# Patient Record
Sex: Female | Born: 1960 | Race: White | Hispanic: No | Smoking: Current every day smoker
Health system: Southern US, Community
[De-identification: ages and names within clinical notes are randomized; demographics above are authoritative.]

## PROBLEM LIST (undated history)

## (undated) DIAGNOSIS — I7300 Raynaud's syndrome without gangrene: Secondary | ICD-10-CM

## (undated) DIAGNOSIS — D751 Secondary polycythemia: Secondary | ICD-10-CM

## (undated) DIAGNOSIS — M329 Systemic lupus erythematosus, unspecified: Secondary | ICD-10-CM

## (undated) DIAGNOSIS — C801 Malignant (primary) neoplasm, unspecified: Secondary | ICD-10-CM

## (undated) DIAGNOSIS — IMO0002 Reserved for concepts with insufficient information to code with codable children: Secondary | ICD-10-CM

## (undated) HISTORY — DX: Secondary polycythemia: D75.1

## (undated) HISTORY — PX: HIP SURGERY: SHX245

## (undated) HISTORY — DX: Malignant (primary) neoplasm, unspecified: C80.1

## (undated) HISTORY — DX: Raynaud's syndrome without gangrene: I73.00

## (undated) HISTORY — DX: Reserved for concepts with insufficient information to code with codable children: IMO0002

## (undated) HISTORY — PX: BACK SURGERY: SHX140

## (undated) HISTORY — PX: ABDOMINAL HYSTERECTOMY: SHX81

## (undated) HISTORY — DX: Systemic lupus erythematosus, unspecified: M32.9

---

## 2009-11-26 DIAGNOSIS — M329 Systemic lupus erythematosus, unspecified: Secondary | ICD-10-CM | POA: Insufficient documentation

## 2011-09-20 DIAGNOSIS — I7300 Raynaud's syndrome without gangrene: Secondary | ICD-10-CM | POA: Insufficient documentation

## 2012-06-09 DIAGNOSIS — L4050 Arthropathic psoriasis, unspecified: Secondary | ICD-10-CM | POA: Insufficient documentation

## 2012-10-31 DIAGNOSIS — G894 Chronic pain syndrome: Secondary | ICD-10-CM | POA: Insufficient documentation

## 2013-11-12 DIAGNOSIS — M25559 Pain in unspecified hip: Secondary | ICD-10-CM | POA: Diagnosis not present

## 2013-11-12 DIAGNOSIS — M25519 Pain in unspecified shoulder: Secondary | ICD-10-CM | POA: Diagnosis not present

## 2014-01-15 DIAGNOSIS — N63 Unspecified lump in unspecified breast: Secondary | ICD-10-CM | POA: Diagnosis not present

## 2014-02-19 DIAGNOSIS — G894 Chronic pain syndrome: Secondary | ICD-10-CM | POA: Diagnosis not present

## 2014-02-19 DIAGNOSIS — M25512 Pain in left shoulder: Secondary | ICD-10-CM | POA: Diagnosis not present

## 2014-02-19 DIAGNOSIS — M25511 Pain in right shoulder: Secondary | ICD-10-CM | POA: Diagnosis not present

## 2014-02-19 DIAGNOSIS — M25551 Pain in right hip: Secondary | ICD-10-CM | POA: Diagnosis not present

## 2014-02-19 DIAGNOSIS — M25552 Pain in left hip: Secondary | ICD-10-CM | POA: Diagnosis not present

## 2014-04-08 DIAGNOSIS — K088 Other specified disorders of teeth and supporting structures: Secondary | ICD-10-CM | POA: Diagnosis not present

## 2014-04-08 DIAGNOSIS — F17200 Nicotine dependence, unspecified, uncomplicated: Secondary | ICD-10-CM | POA: Diagnosis not present

## 2014-05-14 DIAGNOSIS — Z79899 Other long term (current) drug therapy: Secondary | ICD-10-CM | POA: Diagnosis not present

## 2014-05-14 DIAGNOSIS — M1990 Unspecified osteoarthritis, unspecified site: Secondary | ICD-10-CM | POA: Diagnosis not present

## 2014-05-14 DIAGNOSIS — I7300 Raynaud's syndrome without gangrene: Secondary | ICD-10-CM | POA: Diagnosis not present

## 2014-05-14 DIAGNOSIS — Z5181 Encounter for therapeutic drug level monitoring: Secondary | ICD-10-CM | POA: Diagnosis not present

## 2014-05-14 DIAGNOSIS — M25552 Pain in left hip: Secondary | ICD-10-CM | POA: Diagnosis not present

## 2014-05-14 DIAGNOSIS — M25551 Pain in right hip: Secondary | ICD-10-CM | POA: Diagnosis not present

## 2014-06-13 DIAGNOSIS — K047 Periapical abscess without sinus: Secondary | ICD-10-CM | POA: Diagnosis not present

## 2014-07-30 DIAGNOSIS — I7300 Raynaud's syndrome without gangrene: Secondary | ICD-10-CM | POA: Diagnosis not present

## 2014-07-30 DIAGNOSIS — M25512 Pain in left shoulder: Secondary | ICD-10-CM | POA: Diagnosis not present

## 2014-07-30 DIAGNOSIS — M25552 Pain in left hip: Secondary | ICD-10-CM | POA: Diagnosis not present

## 2014-07-30 DIAGNOSIS — M25511 Pain in right shoulder: Secondary | ICD-10-CM | POA: Diagnosis not present

## 2014-07-30 DIAGNOSIS — M1990 Unspecified osteoarthritis, unspecified site: Secondary | ICD-10-CM | POA: Diagnosis not present

## 2014-07-30 DIAGNOSIS — G894 Chronic pain syndrome: Secondary | ICD-10-CM | POA: Diagnosis not present

## 2014-07-30 DIAGNOSIS — M329 Systemic lupus erythematosus, unspecified: Secondary | ICD-10-CM | POA: Diagnosis not present

## 2014-07-30 DIAGNOSIS — M25551 Pain in right hip: Secondary | ICD-10-CM | POA: Diagnosis not present

## 2014-09-04 DIAGNOSIS — M25511 Pain in right shoulder: Secondary | ICD-10-CM | POA: Diagnosis not present

## 2014-09-04 DIAGNOSIS — M25512 Pain in left shoulder: Secondary | ICD-10-CM | POA: Diagnosis not present

## 2014-09-04 DIAGNOSIS — G894 Chronic pain syndrome: Secondary | ICD-10-CM | POA: Diagnosis not present

## 2014-09-04 DIAGNOSIS — I7300 Raynaud's syndrome without gangrene: Secondary | ICD-10-CM | POA: Diagnosis not present

## 2014-09-04 DIAGNOSIS — M25552 Pain in left hip: Secondary | ICD-10-CM | POA: Diagnosis not present

## 2014-09-04 DIAGNOSIS — L4050 Arthropathic psoriasis, unspecified: Secondary | ICD-10-CM | POA: Diagnosis not present

## 2014-09-04 DIAGNOSIS — G8929 Other chronic pain: Secondary | ICD-10-CM | POA: Diagnosis not present

## 2014-09-04 DIAGNOSIS — M25551 Pain in right hip: Secondary | ICD-10-CM | POA: Diagnosis not present

## 2014-09-24 DIAGNOSIS — M25551 Pain in right hip: Secondary | ICD-10-CM | POA: Diagnosis not present

## 2014-09-24 DIAGNOSIS — Z79899 Other long term (current) drug therapy: Secondary | ICD-10-CM | POA: Diagnosis not present

## 2014-09-24 DIAGNOSIS — G894 Chronic pain syndrome: Secondary | ICD-10-CM | POA: Diagnosis not present

## 2014-09-24 DIAGNOSIS — M25552 Pain in left hip: Secondary | ICD-10-CM | POA: Diagnosis not present

## 2014-09-24 DIAGNOSIS — L4050 Arthropathic psoriasis, unspecified: Secondary | ICD-10-CM | POA: Diagnosis not present

## 2014-09-24 DIAGNOSIS — Z5181 Encounter for therapeutic drug level monitoring: Secondary | ICD-10-CM | POA: Diagnosis not present

## 2014-09-24 DIAGNOSIS — I7300 Raynaud's syndrome without gangrene: Secondary | ICD-10-CM | POA: Diagnosis not present

## 2014-11-06 DIAGNOSIS — G8929 Other chronic pain: Secondary | ICD-10-CM | POA: Diagnosis not present

## 2014-11-06 DIAGNOSIS — M792 Neuralgia and neuritis, unspecified: Secondary | ICD-10-CM | POA: Diagnosis not present

## 2014-11-06 DIAGNOSIS — M25552 Pain in left hip: Secondary | ICD-10-CM | POA: Diagnosis not present

## 2014-12-17 DIAGNOSIS — G894 Chronic pain syndrome: Secondary | ICD-10-CM | POA: Diagnosis not present

## 2014-12-17 DIAGNOSIS — M25552 Pain in left hip: Secondary | ICD-10-CM | POA: Diagnosis not present

## 2014-12-17 DIAGNOSIS — G8929 Other chronic pain: Secondary | ICD-10-CM | POA: Diagnosis not present

## 2014-12-30 DIAGNOSIS — M1612 Unilateral primary osteoarthritis, left hip: Secondary | ICD-10-CM | POA: Diagnosis not present

## 2014-12-30 DIAGNOSIS — M25552 Pain in left hip: Secondary | ICD-10-CM | POA: Diagnosis not present

## 2015-02-11 DIAGNOSIS — M25551 Pain in right hip: Secondary | ICD-10-CM | POA: Diagnosis not present

## 2015-02-11 DIAGNOSIS — M25552 Pain in left hip: Secondary | ICD-10-CM | POA: Diagnosis not present

## 2015-02-11 DIAGNOSIS — G894 Chronic pain syndrome: Secondary | ICD-10-CM | POA: Diagnosis not present

## 2015-02-11 DIAGNOSIS — M25512 Pain in left shoulder: Secondary | ICD-10-CM | POA: Diagnosis not present

## 2015-03-09 DIAGNOSIS — M25552 Pain in left hip: Secondary | ICD-10-CM | POA: Diagnosis not present

## 2015-03-09 DIAGNOSIS — M329 Systemic lupus erythematosus, unspecified: Secondary | ICD-10-CM | POA: Diagnosis not present

## 2015-03-09 DIAGNOSIS — F4310 Post-traumatic stress disorder, unspecified: Secondary | ICD-10-CM | POA: Diagnosis not present

## 2015-03-09 DIAGNOSIS — M1612 Unilateral primary osteoarthritis, left hip: Secondary | ICD-10-CM | POA: Diagnosis not present

## 2015-03-09 DIAGNOSIS — M169 Osteoarthritis of hip, unspecified: Secondary | ICD-10-CM | POA: Diagnosis not present

## 2015-03-09 DIAGNOSIS — Z87891 Personal history of nicotine dependence: Secondary | ICD-10-CM | POA: Diagnosis not present

## 2015-03-12 DIAGNOSIS — Z5181 Encounter for therapeutic drug level monitoring: Secondary | ICD-10-CM | POA: Diagnosis not present

## 2015-03-12 DIAGNOSIS — Z79899 Other long term (current) drug therapy: Secondary | ICD-10-CM | POA: Diagnosis not present

## 2015-04-23 DIAGNOSIS — G894 Chronic pain syndrome: Secondary | ICD-10-CM | POA: Diagnosis not present

## 2015-04-23 DIAGNOSIS — M25552 Pain in left hip: Secondary | ICD-10-CM | POA: Diagnosis not present

## 2015-06-18 DIAGNOSIS — M25552 Pain in left hip: Secondary | ICD-10-CM | POA: Diagnosis not present

## 2015-06-18 DIAGNOSIS — M25551 Pain in right hip: Secondary | ICD-10-CM | POA: Diagnosis not present

## 2015-06-18 DIAGNOSIS — G894 Chronic pain syndrome: Secondary | ICD-10-CM | POA: Diagnosis not present

## 2015-07-14 DIAGNOSIS — M25551 Pain in right hip: Secondary | ICD-10-CM | POA: Diagnosis not present

## 2015-07-14 DIAGNOSIS — G8929 Other chronic pain: Secondary | ICD-10-CM | POA: Diagnosis not present

## 2015-07-14 DIAGNOSIS — Z79899 Other long term (current) drug therapy: Secondary | ICD-10-CM | POA: Diagnosis not present

## 2015-07-14 DIAGNOSIS — Z5181 Encounter for therapeutic drug level monitoring: Secondary | ICD-10-CM | POA: Diagnosis not present

## 2015-07-14 DIAGNOSIS — M546 Pain in thoracic spine: Secondary | ICD-10-CM | POA: Diagnosis not present

## 2015-07-14 DIAGNOSIS — G894 Chronic pain syndrome: Secondary | ICD-10-CM | POA: Diagnosis not present

## 2015-07-31 DIAGNOSIS — R079 Chest pain, unspecified: Secondary | ICD-10-CM | POA: Diagnosis not present

## 2015-07-31 DIAGNOSIS — F17210 Nicotine dependence, cigarettes, uncomplicated: Secondary | ICD-10-CM | POA: Diagnosis not present

## 2015-07-31 DIAGNOSIS — F17200 Nicotine dependence, unspecified, uncomplicated: Secondary | ICD-10-CM | POA: Diagnosis not present

## 2015-07-31 DIAGNOSIS — R0789 Other chest pain: Secondary | ICD-10-CM | POA: Diagnosis not present

## 2015-07-31 DIAGNOSIS — Z8249 Family history of ischemic heart disease and other diseases of the circulatory system: Secondary | ICD-10-CM | POA: Diagnosis not present

## 2015-08-30 DIAGNOSIS — M329 Systemic lupus erythematosus, unspecified: Secondary | ICD-10-CM | POA: Diagnosis not present

## 2015-08-30 DIAGNOSIS — G8929 Other chronic pain: Secondary | ICD-10-CM | POA: Diagnosis not present

## 2015-09-07 DIAGNOSIS — G894 Chronic pain syndrome: Secondary | ICD-10-CM | POA: Diagnosis not present

## 2015-09-07 DIAGNOSIS — M25551 Pain in right hip: Secondary | ICD-10-CM | POA: Diagnosis not present

## 2015-09-07 DIAGNOSIS — M25552 Pain in left hip: Secondary | ICD-10-CM | POA: Diagnosis not present

## 2015-09-17 DIAGNOSIS — M25552 Pain in left hip: Secondary | ICD-10-CM | POA: Diagnosis not present

## 2015-09-17 DIAGNOSIS — M25551 Pain in right hip: Secondary | ICD-10-CM | POA: Diagnosis not present

## 2015-09-28 DIAGNOSIS — Z5181 Encounter for therapeutic drug level monitoring: Secondary | ICD-10-CM | POA: Diagnosis not present

## 2015-09-28 DIAGNOSIS — M25552 Pain in left hip: Secondary | ICD-10-CM | POA: Diagnosis not present

## 2015-09-28 DIAGNOSIS — G894 Chronic pain syndrome: Secondary | ICD-10-CM | POA: Diagnosis not present

## 2015-09-28 DIAGNOSIS — M25551 Pain in right hip: Secondary | ICD-10-CM | POA: Diagnosis not present

## 2015-09-28 DIAGNOSIS — Z79899 Other long term (current) drug therapy: Secondary | ICD-10-CM | POA: Diagnosis not present

## 2015-09-28 DIAGNOSIS — M25512 Pain in left shoulder: Secondary | ICD-10-CM | POA: Diagnosis not present

## 2015-10-16 ENCOUNTER — Encounter: Payer: Self-pay | Admitting: Family Medicine

## 2015-10-16 ENCOUNTER — Ambulatory Visit (INDEPENDENT_AMBULATORY_CARE_PROVIDER_SITE_OTHER): Payer: Medicare Other | Admitting: Family Medicine

## 2015-10-16 VITALS — BP 113/59 | HR 59 | Temp 98.0°F | Ht 69.0 in | Wt 135.0 lb

## 2015-10-16 DIAGNOSIS — Z7189 Other specified counseling: Secondary | ICD-10-CM

## 2015-10-16 DIAGNOSIS — Z7689 Persons encountering health services in other specified circumstances: Secondary | ICD-10-CM

## 2015-10-16 DIAGNOSIS — M329 Systemic lupus erythematosus, unspecified: Secondary | ICD-10-CM | POA: Insufficient documentation

## 2015-10-16 NOTE — Progress Notes (Signed)
   Subjective:    Patient ID: Bridget Young, female    DOB: 05/25/1960, 55 y.o.   MRN: [id number]  HPI Patient here today to establish care. She moved here from [facility] and her last PCP was in [place]. She is also seeing a pain Clinic for her Lupus.  She has had lupus for some years now and is been on antimalarials but none for a long time. She does not tolerate prednisone. When she has flares from time to time she mostly just stays and rests on her sofa and weights for the flare to improve. She has no end organ damage that she is aware of from the lupus.  More recently she has had pain in her hips such that she requires morphine. This is being managed by pain clinic in [place].     There are no active problems to display for this patient.  Outpatient Encounter Prescriptions as of 10/16/2015  Medication Sig  . diphenhydrAMINE (BENADRYL) 25 MG tablet Take 25 mg by mouth every 6 (six) hours as needed.  Marland Kitchen morphine (MSIR) 30 MG tablet Take 1 tablet by mouth 2 (two) times daily.  Marland Kitchen topiramate (TOPAMAX) 50 MG tablet Take 1 tablet by mouth 2 (two) times daily.   No facility-administered encounter medications on file as of 10/16/2015.      Review of Systems  Constitutional: Negative.   HENT: Negative.   Eyes: Negative.   Respiratory: Negative.   Cardiovascular: Negative.   Gastrointestinal: Negative.   Endocrine: Negative.   Genitourinary: Negative.   Musculoskeletal: Positive for arthralgias (shoulder and hip pain).  Skin: Negative.   Allergic/Immunologic: Negative.   Neurological: Negative.   Hematological: Negative.   Psychiatric/Behavioral: Negative.        Objective:   Physical Exam  Constitutional: She appears well-developed and well-nourished.  Cardiovascular: Normal rate, regular rhythm and normal heart sounds.   Pulmonary/Chest: Effort normal and breath sounds normal.  Musculoskeletal:  Hips: Increased pain with internal/external rotation and patient has  limitation in flexion. Is able to flex to about 90.   BP 113/59 mmHg  Pulse 59  Temp(Src) 98 F (36.7 C) (Oral)  Ht '5\' 9"'$  (1.753 m)  Wt 135 lb (61.236 kg)  BMI 19.93 kg/m2        Assessment & Plan:  1. Establishing care with new doctor, encounter for   2. Lupus ([place]) We'll assess renal function liver and blood counts. May need referral to rheumatologist in the future but patient is reluctant to do so. May also need hip replacements based on her history but she also talked about mobility device  Wardell Honour MD - CBC with Differential/Platelet - CMP14+EGFR

## 2015-10-16 NOTE — Patient Instructions (Signed)
Continue current medications. Continue good therapeutic lifestyle changes which include good diet and exercise. Fall precautions discussed with patient. If an FOBT was given today- please return it to our front desk. If you are over 53 years old - you may need Prevnar 13 or the adult Pneumonia vaccine.   After your visit with us today you will receive a survey in the mail or online from [employer] regarding your care with us. Please take a moment to fill this out. Your feedback is very important to us as you can help us better understand your patient needs as well as improve your experience and satisfaction. WE CARE ABOUT YOU!!!    

## 2015-10-17 LAB — CMP14+EGFR
ALBUMIN: 4.3 g/dL (ref 3.5–5.5)
ALK PHOS: 78 IU/L (ref 39–117)
ALT: 24 IU/L (ref 0–32)
AST: 21 IU/L (ref 0–40)
Albumin/Globulin Ratio: 1.8 (ref 1.2–2.2)
BUN / CREAT RATIO: 15 (ref 9–23)
BUN: 11 mg/dL (ref 6–24)
CHLORIDE: 103 mmol/L (ref 96–106)
CO2: 21 mmol/L (ref 18–29)
Calcium: 9 mg/dL (ref 8.7–10.2)
Creatinine, Ser: 0.72 mg/dL (ref 0.57–1.00)
GFR calc Af Amer: 111 mL/min/{1.73_m2} (ref >59)
GFR calc non Af Amer: 97 mL/min/{1.73_m2} (ref >59)
GLUCOSE: 83 mg/dL (ref 65–99)
Globulin, Total: 2.4 g/dL (ref 1.5–4.5)
Potassium: 3.7 mmol/L (ref 3.5–5.2)
Sodium: 140 mmol/L (ref 134–144)
Total Protein: 6.7 g/dL (ref 6.0–8.5)

## 2015-10-17 LAB — CBC WITH DIFFERENTIAL/PLATELET
BASOS ABS: 0 10*3/uL (ref 0.0–0.2)
Basos: 0 %
EOS (ABSOLUTE): 0.2 10*3/uL (ref 0.0–0.4)
Eos: 2 %
Hematocrit: 46.1 % (ref 34.0–46.6)
Hemoglobin: 15.6 g/dL (ref 11.1–15.9)
Immature Grans (Abs): 0 10*3/uL (ref 0.0–0.1)
Immature Granulocytes: 0 %
LYMPHS ABS: 2.2 10*3/uL (ref 0.7–3.1)
Lymphs: 27 %
MCH: 29.3 pg (ref 26.6–33.0)
MCHC: 33.8 g/dL (ref 31.5–35.7)
MCV: 87 fL (ref 79–97)
MONOS ABS: 0.4 10*3/uL (ref 0.1–0.9)
Monocytes: 5 %
NEUTROS ABS: 5.4 10*3/uL (ref 1.4–7.0)
Neutrophils: 66 %
PLATELETS: 249 10*3/uL (ref 150–379)
RBC: 5.33 x10E6/uL — AB (ref 3.77–5.28)
RDW: 14.3 % (ref 12.3–15.4)
WBC: 8.3 10*3/uL (ref 3.4–10.8)

## 2015-11-24 DIAGNOSIS — G894 Chronic pain syndrome: Secondary | ICD-10-CM | POA: Diagnosis not present

## 2015-11-24 DIAGNOSIS — M25551 Pain in right hip: Secondary | ICD-10-CM | POA: Diagnosis not present

## 2015-11-24 DIAGNOSIS — M25552 Pain in left hip: Secondary | ICD-10-CM | POA: Diagnosis not present

## 2015-11-24 DIAGNOSIS — M25512 Pain in left shoulder: Secondary | ICD-10-CM | POA: Diagnosis not present

## 2016-01-13 DIAGNOSIS — Z5181 Encounter for therapeutic drug level monitoring: Secondary | ICD-10-CM | POA: Diagnosis not present

## 2016-01-13 DIAGNOSIS — G894 Chronic pain syndrome: Secondary | ICD-10-CM | POA: Diagnosis not present

## 2016-01-13 DIAGNOSIS — M25512 Pain in left shoulder: Secondary | ICD-10-CM | POA: Diagnosis not present

## 2016-01-13 DIAGNOSIS — Z79899 Other long term (current) drug therapy: Secondary | ICD-10-CM | POA: Diagnosis not present

## 2016-01-13 DIAGNOSIS — M25551 Pain in right hip: Secondary | ICD-10-CM | POA: Diagnosis not present

## 2016-01-13 DIAGNOSIS — M25552 Pain in left hip: Secondary | ICD-10-CM | POA: Diagnosis not present

## 2016-01-25 DIAGNOSIS — M25552 Pain in left hip: Secondary | ICD-10-CM | POA: Diagnosis not present

## 2016-01-25 DIAGNOSIS — M25512 Pain in left shoulder: Secondary | ICD-10-CM | POA: Diagnosis not present

## 2016-01-25 DIAGNOSIS — M169 Osteoarthritis of hip, unspecified: Secondary | ICD-10-CM | POA: Diagnosis not present

## 2016-01-25 DIAGNOSIS — Z8541 Personal history of malignant neoplasm of cervix uteri: Secondary | ICD-10-CM | POA: Diagnosis not present

## 2016-01-25 DIAGNOSIS — F909 Attention-deficit hyperactivity disorder, unspecified type: Secondary | ICD-10-CM | POA: Diagnosis not present

## 2016-01-25 DIAGNOSIS — M25511 Pain in right shoulder: Secondary | ICD-10-CM | POA: Diagnosis not present

## 2016-01-25 DIAGNOSIS — F17200 Nicotine dependence, unspecified, uncomplicated: Secondary | ICD-10-CM | POA: Diagnosis not present

## 2016-01-25 DIAGNOSIS — G894 Chronic pain syndrome: Secondary | ICD-10-CM | POA: Diagnosis not present

## 2016-01-25 DIAGNOSIS — F4310 Post-traumatic stress disorder, unspecified: Secondary | ICD-10-CM | POA: Diagnosis not present

## 2016-01-25 DIAGNOSIS — M25551 Pain in right hip: Secondary | ICD-10-CM | POA: Diagnosis not present

## 2016-01-25 DIAGNOSIS — M329 Systemic lupus erythematosus, unspecified: Secondary | ICD-10-CM | POA: Diagnosis not present

## 2016-02-08 DIAGNOSIS — M329 Systemic lupus erythematosus, unspecified: Secondary | ICD-10-CM | POA: Diagnosis not present

## 2016-02-08 DIAGNOSIS — Z888 Allergy status to other drugs, medicaments and biological substances status: Secondary | ICD-10-CM | POA: Diagnosis not present

## 2016-02-08 DIAGNOSIS — M25551 Pain in right hip: Secondary | ICD-10-CM | POA: Diagnosis not present

## 2016-02-08 DIAGNOSIS — Z79899 Other long term (current) drug therapy: Secondary | ICD-10-CM | POA: Diagnosis not present

## 2016-02-08 DIAGNOSIS — M169 Osteoarthritis of hip, unspecified: Secondary | ICD-10-CM | POA: Diagnosis not present

## 2016-02-08 DIAGNOSIS — F4310 Post-traumatic stress disorder, unspecified: Secondary | ICD-10-CM | POA: Diagnosis not present

## 2016-02-08 DIAGNOSIS — F17200 Nicotine dependence, unspecified, uncomplicated: Secondary | ICD-10-CM | POA: Diagnosis not present

## 2016-02-08 DIAGNOSIS — F909 Attention-deficit hyperactivity disorder, unspecified type: Secondary | ICD-10-CM | POA: Diagnosis not present

## 2016-02-08 DIAGNOSIS — M25552 Pain in left hip: Secondary | ICD-10-CM | POA: Diagnosis not present

## 2016-02-08 DIAGNOSIS — M160 Bilateral primary osteoarthritis of hip: Secondary | ICD-10-CM | POA: Diagnosis not present

## 2016-03-01 ENCOUNTER — Encounter: Payer: Medicare Other | Admitting: *Deleted

## 2016-03-05 LAB — BASIC METABOLIC PANEL: Glucose: 97 mg/dL

## 2016-03-10 DIAGNOSIS — M25552 Pain in left hip: Secondary | ICD-10-CM | POA: Diagnosis not present

## 2016-03-10 DIAGNOSIS — G894 Chronic pain syndrome: Secondary | ICD-10-CM | POA: Diagnosis not present

## 2016-03-10 DIAGNOSIS — Z79899 Other long term (current) drug therapy: Secondary | ICD-10-CM | POA: Diagnosis not present

## 2016-03-10 DIAGNOSIS — M25551 Pain in right hip: Secondary | ICD-10-CM | POA: Diagnosis not present

## 2016-03-10 DIAGNOSIS — Z1231 Encounter for screening mammogram for malignant neoplasm of breast: Secondary | ICD-10-CM | POA: Diagnosis not present

## 2016-03-10 DIAGNOSIS — Z5181 Encounter for therapeutic drug level monitoring: Secondary | ICD-10-CM | POA: Diagnosis not present

## 2016-05-19 DIAGNOSIS — M1612 Unilateral primary osteoarthritis, left hip: Secondary | ICD-10-CM | POA: Diagnosis not present

## 2016-05-19 DIAGNOSIS — M25551 Pain in right hip: Secondary | ICD-10-CM | POA: Diagnosis not present

## 2016-05-19 DIAGNOSIS — M25552 Pain in left hip: Secondary | ICD-10-CM | POA: Diagnosis not present

## 2016-05-19 DIAGNOSIS — G894 Chronic pain syndrome: Secondary | ICD-10-CM | POA: Diagnosis not present

## 2016-05-19 DIAGNOSIS — Z1231 Encounter for screening mammogram for malignant neoplasm of breast: Secondary | ICD-10-CM | POA: Diagnosis not present

## 2016-07-11 DIAGNOSIS — G894 Chronic pain syndrome: Secondary | ICD-10-CM | POA: Diagnosis not present

## 2016-07-11 DIAGNOSIS — Z79899 Other long term (current) drug therapy: Secondary | ICD-10-CM | POA: Diagnosis not present

## 2016-07-11 DIAGNOSIS — M546 Pain in thoracic spine: Secondary | ICD-10-CM | POA: Diagnosis not present

## 2016-07-11 DIAGNOSIS — Z5181 Encounter for therapeutic drug level monitoring: Secondary | ICD-10-CM | POA: Diagnosis not present

## 2016-07-11 DIAGNOSIS — M1612 Unilateral primary osteoarthritis, left hip: Secondary | ICD-10-CM | POA: Diagnosis not present

## 2016-07-11 DIAGNOSIS — M25552 Pain in left hip: Secondary | ICD-10-CM | POA: Diagnosis not present

## 2016-07-26 ENCOUNTER — Other Ambulatory Visit: Payer: Self-pay

## 2016-07-26 ENCOUNTER — Encounter: Payer: Self-pay | Admitting: Family Medicine

## 2016-07-26 DIAGNOSIS — L930 Discoid lupus erythematosus: Secondary | ICD-10-CM

## 2016-08-09 DIAGNOSIS — M47816 Spondylosis without myelopathy or radiculopathy, lumbar region: Secondary | ICD-10-CM | POA: Diagnosis not present

## 2016-08-15 DIAGNOSIS — F909 Attention-deficit hyperactivity disorder, unspecified type: Secondary | ICD-10-CM | POA: Diagnosis not present

## 2016-08-15 DIAGNOSIS — M25552 Pain in left hip: Secondary | ICD-10-CM | POA: Diagnosis not present

## 2016-08-15 DIAGNOSIS — M1612 Unilateral primary osteoarthritis, left hip: Secondary | ICD-10-CM | POA: Diagnosis not present

## 2016-08-15 DIAGNOSIS — F17200 Nicotine dependence, unspecified, uncomplicated: Secondary | ICD-10-CM | POA: Diagnosis not present

## 2016-08-15 DIAGNOSIS — G894 Chronic pain syndrome: Secondary | ICD-10-CM | POA: Diagnosis not present

## 2016-08-15 DIAGNOSIS — Z888 Allergy status to other drugs, medicaments and biological substances status: Secondary | ICD-10-CM | POA: Diagnosis not present

## 2016-08-15 DIAGNOSIS — J45909 Unspecified asthma, uncomplicated: Secondary | ICD-10-CM | POA: Diagnosis not present

## 2016-08-24 DIAGNOSIS — M533 Sacrococcygeal disorders, not elsewhere classified: Secondary | ICD-10-CM | POA: Diagnosis not present

## 2016-09-06 DIAGNOSIS — I7300 Raynaud's syndrome without gangrene: Secondary | ICD-10-CM | POA: Diagnosis not present

## 2016-09-06 DIAGNOSIS — R5382 Chronic fatigue, unspecified: Secondary | ICD-10-CM | POA: Diagnosis not present

## 2016-09-06 DIAGNOSIS — Z6820 Body mass index (BMI) 20.0-20.9, adult: Secondary | ICD-10-CM | POA: Diagnosis not present

## 2016-09-06 DIAGNOSIS — G894 Chronic pain syndrome: Secondary | ICD-10-CM | POA: Diagnosis not present

## 2016-09-06 DIAGNOSIS — Z8739 Personal history of other diseases of the musculoskeletal system and connective tissue: Secondary | ICD-10-CM | POA: Diagnosis not present

## 2016-09-06 DIAGNOSIS — Z720 Tobacco use: Secondary | ICD-10-CM | POA: Diagnosis not present

## 2016-09-06 DIAGNOSIS — M2550 Pain in unspecified joint: Secondary | ICD-10-CM | POA: Diagnosis not present

## 2016-09-12 DIAGNOSIS — R0789 Other chest pain: Secondary | ICD-10-CM | POA: Diagnosis not present

## 2016-09-12 DIAGNOSIS — R079 Chest pain, unspecified: Secondary | ICD-10-CM | POA: Diagnosis not present

## 2016-09-12 DIAGNOSIS — Z7982 Long term (current) use of aspirin: Secondary | ICD-10-CM | POA: Diagnosis not present

## 2016-09-12 DIAGNOSIS — F909 Attention-deficit hyperactivity disorder, unspecified type: Secondary | ICD-10-CM | POA: Diagnosis not present

## 2016-09-12 DIAGNOSIS — R002 Palpitations: Secondary | ICD-10-CM | POA: Diagnosis not present

## 2016-09-12 DIAGNOSIS — F4310 Post-traumatic stress disorder, unspecified: Secondary | ICD-10-CM | POA: Diagnosis not present

## 2016-09-12 DIAGNOSIS — Z79899 Other long term (current) drug therapy: Secondary | ICD-10-CM | POA: Diagnosis not present

## 2016-09-12 DIAGNOSIS — E860 Dehydration: Secondary | ICD-10-CM | POA: Diagnosis not present

## 2016-09-12 DIAGNOSIS — F17200 Nicotine dependence, unspecified, uncomplicated: Secondary | ICD-10-CM | POA: Diagnosis not present

## 2016-09-12 DIAGNOSIS — Z888 Allergy status to other drugs, medicaments and biological substances status: Secondary | ICD-10-CM | POA: Diagnosis not present

## 2016-09-12 DIAGNOSIS — R42 Dizziness and giddiness: Secondary | ICD-10-CM | POA: Diagnosis not present

## 2016-09-23 ENCOUNTER — Ambulatory Visit (INDEPENDENT_AMBULATORY_CARE_PROVIDER_SITE_OTHER): Payer: Medicare Other | Admitting: Family Medicine

## 2016-09-23 ENCOUNTER — Encounter: Payer: Self-pay | Admitting: Family Medicine

## 2016-09-23 VITALS — BP 116/60 | HR 66 | Temp 98.3°F | Ht 69.0 in | Wt 154.0 lb

## 2016-09-23 DIAGNOSIS — M329 Systemic lupus erythematosus, unspecified: Secondary | ICD-10-CM | POA: Diagnosis not present

## 2016-09-23 MED ORDER — BUPRENORPHINE HCL 300 MCG BU FILM: 1.0000 | ORAL_FILM | Freq: Two times a day (BID) | BUCCAL | 0 refills | Status: AC

## 2016-09-23 NOTE — Progress Notes (Signed)
Subjective:  Patient ID: Bridget Young, female    DOB: [birth year]  Age: 56 y.o. MRN: [id number]  CC: Bone Pain (pt here today c/o bone pain and was recently dx'd with "polycythemia" and she missed her appt with the pain clinic and wants a rx for pain medicine.)   HPI Esraa Seres presents for Running out of her buprenorphine buccal strips. She has a long-term relationship with [facility]. She has been with them for about 8 years. She went to see them for procedure recently and began having sweats and blood pressure elevated. Therefore she was sent to the emergency room from their office. Unfortunately in the heat of the moment her pain medication was not renewed. [facility] [place] CSR S report shows that she is due to run out on the 20th which is 2 days away and over the weekend. She does not have reliable transportation to get her to the clinic which is an hours drive from here. The source of her pain is in her hips. She has 8-10/10 pain related to lupus erythematosus. Relief with [place] generally considered adequate by her.  History Meryem has a past medical history of Cancer [facility]) (age 33); Lupus; and Raynaud's disease.   She has a past surgical history that includes Abdominal hysterectomy (age 81) and Hip surgery (Bilateral).   Her family history includes Cancer in her father and sister; Heart disease in her brother.She reports that she has been smoking.  She has a 22.50 pack-year smoking history. She does not have any smokeless tobacco history on file. She reports that she does not drink alcohol or use drugs.    ROS Review of Systems  Constitutional: Negative for activity change and appetite change.  HENT: Negative.   Eyes: Negative.   Respiratory: Negative for cough, chest tightness and shortness of breath.   Cardiovascular: Negative for chest pain.  Gastrointestinal: Negative for abdominal pain.  Musculoskeletal: Positive for arthralgias.    Objective:  BP  116/60   Pulse 66   Temp 98.3 F (36.8 C) (Oral)   Ht 5\' 9"  (1.753 m)   Wt 154 lb (69.9 kg)   BMI 22.74 kg/m   BP Readings from Last 3 Encounters:  09/23/16 116/60  10/16/15 (!) 113/59    Wt Readings from Last 3 Encounters:  09/23/16 154 lb (69.9 kg)  10/16/15 135 lb (61.2 kg)     Physical Exam  Constitutional: She is oriented to person, place, and time. She appears well-developed and well-nourished.  HENT:  Head: Normocephalic and atraumatic.  Eyes: EOM are normal. Pupils are equal, round, and reactive to light.  Neck: Normal range of motion. Neck supple.  Cardiovascular: Normal rate and regular rhythm.   Pulmonary/Chest: Effort normal.  Neurological: She is alert and oriented to person, place, and time.  Skin: Skin is warm and dry.  Psychiatric: She has a normal mood and affect. Her behavior is normal. Judgment and thought content normal.      Assessment & Plan:   Lougenia was seen today for bone pain.  Diagnoses and all orders for this visit:  Systemic lupus erythematosus, unspecified SLE type, unspecified organ involvement status ([place])  Other orders -     Buprenorphine HCl 300 MCG FILM; Place 1 Film inside cheek 2 (two) times daily.    I have discontinued Ms. Savage's topiramate, morphine, and diphenhydrAMINE. I have also changed her Buprenorphine HCl. Additionally, I am having her maintain her Fish Oil.  Allergies as of 09/23/2016  Reactions   Clonidine Derivatives Other (See Comments)      Medication List       Accurate as of 09/23/16  6:01 PM. Always use your most recent med list.          Buprenorphine HCl 300 MCG Film Place 1 Film inside cheek 2 (two) times daily.   Fish Oil 1000 MG Caps Take by mouth.      I spoke to Dr. Jamesetta Orleans who is a physician at [facility] pain by phone. I explained the story as the patient gave history above. He verified that she was appearing patient and that she was due for refills. He verified that that  refill had not been given at their office. He thanked me for being willing to help her out in this situation and checking with them about the problem. Review of the [place] CSR S confirms that her prescriptions have been coming from [facility] pain clinic. That report was reviewed and is attached showing monthly prescriptions of pain medication she was recently switched from immediate release morphine over to the [place], by [employer]  [facility] [place] pain [facility]. He was not available for discussion by phone however Dr. Jamesetta Orleans on Young for him spoke with me as noted above. Follow-up: For this problem should be with [facility]. Patient was told that this was a one-time only exception based on circumstances that are unfortunate in that they were beyond her control.  Claretta Fraise, M.D.

## 2016-09-26 DIAGNOSIS — M25552 Pain in left hip: Secondary | ICD-10-CM | POA: Diagnosis not present

## 2016-09-26 DIAGNOSIS — M47816 Spondylosis without myelopathy or radiculopathy, lumbar region: Secondary | ICD-10-CM | POA: Diagnosis not present

## 2016-09-26 DIAGNOSIS — M25551 Pain in right hip: Secondary | ICD-10-CM | POA: Diagnosis not present

## 2016-09-26 DIAGNOSIS — Z1231 Encounter for screening mammogram for malignant neoplasm of breast: Secondary | ICD-10-CM | POA: Diagnosis not present

## 2016-09-26 DIAGNOSIS — M791 Myalgia: Secondary | ICD-10-CM | POA: Diagnosis not present

## 2016-10-12 ENCOUNTER — Telehealth: Payer: Self-pay | Admitting: Family Medicine

## 2016-10-12 DIAGNOSIS — Z6820 Body mass index (BMI) 20.0-20.9, adult: Secondary | ICD-10-CM | POA: Diagnosis not present

## 2016-10-12 DIAGNOSIS — R5382 Chronic fatigue, unspecified: Secondary | ICD-10-CM | POA: Diagnosis not present

## 2016-10-12 DIAGNOSIS — M79605 Pain in left leg: Secondary | ICD-10-CM | POA: Diagnosis not present

## 2016-10-12 DIAGNOSIS — Z720 Tobacco use: Secondary | ICD-10-CM | POA: Diagnosis not present

## 2016-10-12 DIAGNOSIS — M2550 Pain in unspecified joint: Secondary | ICD-10-CM | POA: Diagnosis not present

## 2016-10-12 DIAGNOSIS — Z8739 Personal history of other diseases of the musculoskeletal system and connective tissue: Secondary | ICD-10-CM | POA: Diagnosis not present

## 2016-10-12 DIAGNOSIS — D751 Secondary polycythemia: Secondary | ICD-10-CM | POA: Diagnosis not present

## 2016-10-12 DIAGNOSIS — G894 Chronic pain syndrome: Secondary | ICD-10-CM | POA: Diagnosis not present

## 2016-10-12 DIAGNOSIS — I7300 Raynaud's syndrome without gangrene: Secondary | ICD-10-CM | POA: Diagnosis not present

## 2016-11-07 DIAGNOSIS — Z1231 Encounter for screening mammogram for malignant neoplasm of breast: Secondary | ICD-10-CM | POA: Diagnosis not present

## 2016-11-07 DIAGNOSIS — G894 Chronic pain syndrome: Secondary | ICD-10-CM | POA: Diagnosis not present

## 2016-11-07 DIAGNOSIS — M791 Myalgia: Secondary | ICD-10-CM | POA: Diagnosis not present

## 2016-11-07 DIAGNOSIS — M47816 Spondylosis without myelopathy or radiculopathy, lumbar region: Secondary | ICD-10-CM | POA: Diagnosis not present

## 2016-11-07 DIAGNOSIS — M1612 Unilateral primary osteoarthritis, left hip: Secondary | ICD-10-CM | POA: Diagnosis not present

## 2016-11-28 ENCOUNTER — Telehealth: Payer: Self-pay | Admitting: Family Medicine

## 2016-12-12 ENCOUNTER — Ambulatory Visit (INDEPENDENT_AMBULATORY_CARE_PROVIDER_SITE_OTHER): Payer: Medicare Other | Admitting: Family Medicine

## 2016-12-12 ENCOUNTER — Encounter: Payer: Self-pay | Admitting: Family Medicine

## 2016-12-12 VITALS — BP 134/85 | HR 57 | Temp 97.1°F | Ht 69.0 in | Wt 166.6 lb

## 2016-12-12 DIAGNOSIS — M329 Systemic lupus erythematosus, unspecified: Secondary | ICD-10-CM

## 2016-12-12 DIAGNOSIS — Z0000 Encounter for general adult medical examination without abnormal findings: Secondary | ICD-10-CM

## 2016-12-12 MED ORDER — PREDNISONE 10 MG PO TABS: ORAL_TABLET | ORAL | 0 refills | Status: DC

## 2016-12-12 NOTE — Patient Instructions (Signed)
Great to meet you!  Please call rheumatology to bump up your usual appointment, they may have better long term treatments

## 2016-12-12 NOTE — Progress Notes (Signed)
   HPI  Patient presents today here to discuss joint pain and rash.  Patient states that she has a history of psoriasis, on review of her chart shows a history of SLE  She complains of worsening bilateral shoulder and hip pains as well as worsening rash on the bilateral thighs and left elbow. She states that she recently had a prednisone course which helped but her symptoms return right after it was over. She's not scheduled follow-up with rheumatology for about 2 months.  Pt  Will consider colonoscopy, she is recently diagnosed with polycythemia and has had regular lab work with her rheumatologist. Patient is willing to see a gynecologist to consider Pap smear  PMH: Smoking status noted ROS: Per HPI  Objective: BP 134/85   Pulse (!) 57   Temp (!) 97.1 F (36.2 C) (Oral)   Ht 5\' 9"  (1.753 m)   Wt 166 lb 9.6 oz (75.6 kg)   BMI 24.60 kg/m  Gen: NAD, alert, cooperative with exam HEENT: NCAT CV: RRR, good S1/S2, no murmur Resp: CTABL, no wheezes, non-labored Ext: No edema, warm Neuro: Alert and oriented, No gross deficits Skin:  L elbow with coin shaped erythematous slightly scaly lesion  Assessment and plan:  # SLE Currrent flare with arthritis and rash, could be discoid plaques on SLE.  Prednisone taper Close f/u with Dr. Lenna Gilford  # HCM Discussed C scope, she will consider Has general labs with dr. Lenna Gilford- rheum GYN- refer to [facility]     Orders Placed This Encounter  Procedures  . Ambulatory referral to Obstetrics / Gynecology    Referral Priority:   Routine    Referral Type:   Consultation    Referral Reason:   Specialty Services Required    Referred to Provider:   Allyn Kenner, DO    Requested Specialty:   Obstetrics and Gynecology    Number of Visits Requested:   1    Meds ordered this encounter  Medications  . BELBUCA 600 MCG FILM    Sig: place ONE film INSIDE cheek every 12 hours    Refill:  0  . predniSONE (DELTASONE) 10 MG tablet   Sig: Take 4 pills a day for 3 days, then 3 pills a day for 3 days, then 2 pills a day for 3 days, then 1 pill a day for 3 days, then stop    Dispense:  30 tablet    Refill:  0    Laroy Apple, MD [facility] 12/12/2016, 10:34 AM

## 2016-12-13 ENCOUNTER — Telehealth: Payer: Self-pay | Admitting: Family Medicine

## 2016-12-13 NOTE — Telephone Encounter (Signed)
I would recommend using melatonin or benadryl fror sleep, may go ahead and reduce dose to 30 mg to reduce effects.   These side effects can happen with prednisone.   Laroy Apple, MD [facility] 12/13/2016, 2:39 PM

## 2016-12-13 NOTE — Telephone Encounter (Signed)
Pt aware.

## 2017-01-11 DIAGNOSIS — G894 Chronic pain syndrome: Secondary | ICD-10-CM | POA: Diagnosis not present

## 2017-01-11 DIAGNOSIS — Z5181 Encounter for therapeutic drug level monitoring: Secondary | ICD-10-CM | POA: Diagnosis not present

## 2017-01-11 DIAGNOSIS — M791 Myalgia: Secondary | ICD-10-CM | POA: Diagnosis not present

## 2017-01-11 DIAGNOSIS — Z1231 Encounter for screening mammogram for malignant neoplasm of breast: Secondary | ICD-10-CM | POA: Diagnosis not present

## 2017-01-11 DIAGNOSIS — M25552 Pain in left hip: Secondary | ICD-10-CM | POA: Diagnosis not present

## 2017-01-11 DIAGNOSIS — M25511 Pain in right shoulder: Secondary | ICD-10-CM | POA: Diagnosis not present

## 2017-01-11 DIAGNOSIS — Z79899 Other long term (current) drug therapy: Secondary | ICD-10-CM | POA: Diagnosis not present

## 2017-02-09 DIAGNOSIS — Z720 Tobacco use: Secondary | ICD-10-CM | POA: Diagnosis not present

## 2017-02-09 DIAGNOSIS — L409 Psoriasis, unspecified: Secondary | ICD-10-CM | POA: Diagnosis not present

## 2017-02-09 DIAGNOSIS — M79605 Pain in left leg: Secondary | ICD-10-CM | POA: Diagnosis not present

## 2017-02-09 DIAGNOSIS — I7300 Raynaud's syndrome without gangrene: Secondary | ICD-10-CM | POA: Diagnosis not present

## 2017-02-09 DIAGNOSIS — D751 Secondary polycythemia: Secondary | ICD-10-CM | POA: Diagnosis not present

## 2017-02-09 DIAGNOSIS — Z8739 Personal history of other diseases of the musculoskeletal system and connective tissue: Secondary | ICD-10-CM | POA: Diagnosis not present

## 2017-02-09 DIAGNOSIS — E559 Vitamin D deficiency, unspecified: Secondary | ICD-10-CM | POA: Diagnosis not present

## 2017-02-09 DIAGNOSIS — E663 Overweight: Secondary | ICD-10-CM | POA: Diagnosis not present

## 2017-02-09 DIAGNOSIS — G894 Chronic pain syndrome: Secondary | ICD-10-CM | POA: Diagnosis not present

## 2017-02-09 DIAGNOSIS — M2550 Pain in unspecified joint: Secondary | ICD-10-CM | POA: Diagnosis not present

## 2017-02-09 DIAGNOSIS — E538 Deficiency of other specified B group vitamins: Secondary | ICD-10-CM | POA: Diagnosis not present

## 2017-02-09 DIAGNOSIS — M1990 Unspecified osteoarthritis, unspecified site: Secondary | ICD-10-CM | POA: Diagnosis not present

## 2017-02-09 DIAGNOSIS — R5382 Chronic fatigue, unspecified: Secondary | ICD-10-CM | POA: Diagnosis not present

## 2017-02-09 DIAGNOSIS — Z6825 Body mass index (BMI) 25.0-25.9, adult: Secondary | ICD-10-CM | POA: Diagnosis not present

## 2017-02-20 DIAGNOSIS — M47816 Spondylosis without myelopathy or radiculopathy, lumbar region: Secondary | ICD-10-CM | POA: Diagnosis not present

## 2017-02-20 DIAGNOSIS — M533 Sacrococcygeal disorders, not elsewhere classified: Secondary | ICD-10-CM | POA: Diagnosis not present

## 2017-02-20 DIAGNOSIS — M25552 Pain in left hip: Secondary | ICD-10-CM | POA: Diagnosis not present

## 2017-02-20 DIAGNOSIS — Z1231 Encounter for screening mammogram for malignant neoplasm of breast: Secondary | ICD-10-CM | POA: Diagnosis not present

## 2017-02-20 DIAGNOSIS — M7918 Myalgia, other site: Secondary | ICD-10-CM | POA: Diagnosis not present

## 2017-02-27 DIAGNOSIS — M1612 Unilateral primary osteoarthritis, left hip: Secondary | ICD-10-CM | POA: Diagnosis not present

## 2017-02-27 DIAGNOSIS — F4310 Post-traumatic stress disorder, unspecified: Secondary | ICD-10-CM | POA: Diagnosis not present

## 2017-02-27 DIAGNOSIS — L409 Psoriasis, unspecified: Secondary | ICD-10-CM | POA: Diagnosis not present

## 2017-02-27 DIAGNOSIS — G894 Chronic pain syndrome: Secondary | ICD-10-CM | POA: Diagnosis not present

## 2017-02-27 DIAGNOSIS — M25552 Pain in left hip: Secondary | ICD-10-CM | POA: Diagnosis not present

## 2017-02-27 DIAGNOSIS — M329 Systemic lupus erythematosus, unspecified: Secondary | ICD-10-CM | POA: Diagnosis not present

## 2017-02-27 DIAGNOSIS — Z888 Allergy status to other drugs, medicaments and biological substances status: Secondary | ICD-10-CM | POA: Diagnosis not present

## 2017-02-27 DIAGNOSIS — F17290 Nicotine dependence, other tobacco product, uncomplicated: Secondary | ICD-10-CM | POA: Diagnosis not present

## 2017-03-01 NOTE — Congregational Nurse Program (Unsigned)
Congregational Nurse Program Note  Date of Encounter: 03/01/2017  Past Medical History: Past Medical History:  Diagnosis Date  . Cancer [facility]) age 56   cervical   . Lupus   . Raynaud's disease     Encounter Details:     CNP Questionnaire - 03/01/17 2159      Patient Demographics   Is this a new or existing patient? Existing   Patient is considered a/an Not Applicable   Race Caucasian/White     Patient Assistance   Location of Patient Assistance LOT [id number]   Patient's financial/insurance status Private Insurance Coverage   Uninsured Patient ([place] Card/Care Connects) No   Patient referred to apply for the following financial assistance Not Applicable   Food insecurities addressed Provided food supplies   Transportation assistance No   Assistance securing medications No     Encounter Details   Primary purpose of visit Education/Health Concerns;Post PCP Visit   Was an Emergency Department visit averted? Not Applicable   Does patient have a medical provider? Yes   Patient referred to Not Applicable   Was a mental health screening completed? (GAINS tool) No   Does patient have dental issues? No   Does patient have vision issues? No   Does your patient have an abnormal blood pressure today? No   Since previous encounter, have you referred patient for abnormal blood pressure that resulted in a new diagnosis or medication change? No   Does your patient have an abnormal blood glucose today? No   Since previous encounter, have you referred patient for abnormal blood glucose that resulted in a new diagnosis or medication change? No   Was there a life-saving intervention made? No     02/10/17 Has to start taking Methotrexate 0.22mlSQ weekly,want to make sure she is administering correctly. Theron Arista RN [phone]

## 2017-03-27 DIAGNOSIS — G894 Chronic pain syndrome: Secondary | ICD-10-CM | POA: Diagnosis not present

## 2017-03-27 DIAGNOSIS — F17200 Nicotine dependence, unspecified, uncomplicated: Secondary | ICD-10-CM | POA: Diagnosis not present

## 2017-03-27 DIAGNOSIS — Z7982 Long term (current) use of aspirin: Secondary | ICD-10-CM | POA: Diagnosis not present

## 2017-03-27 DIAGNOSIS — F4310 Post-traumatic stress disorder, unspecified: Secondary | ICD-10-CM | POA: Diagnosis not present

## 2017-03-27 DIAGNOSIS — J45909 Unspecified asthma, uncomplicated: Secondary | ICD-10-CM | POA: Diagnosis not present

## 2017-03-27 DIAGNOSIS — M25552 Pain in left hip: Secondary | ICD-10-CM | POA: Diagnosis not present

## 2017-03-27 DIAGNOSIS — Z79899 Other long term (current) drug therapy: Secondary | ICD-10-CM | POA: Diagnosis not present

## 2017-03-27 DIAGNOSIS — Z888 Allergy status to other drugs, medicaments and biological substances status: Secondary | ICD-10-CM | POA: Diagnosis not present

## 2017-03-27 DIAGNOSIS — M1611 Unilateral primary osteoarthritis, right hip: Secondary | ICD-10-CM | POA: Diagnosis not present

## 2017-03-27 DIAGNOSIS — M329 Systemic lupus erythematosus, unspecified: Secondary | ICD-10-CM | POA: Diagnosis not present

## 2017-04-06 ENCOUNTER — Ambulatory Visit: Payer: Medicare Other

## 2017-04-11 DIAGNOSIS — R5382 Chronic fatigue, unspecified: Secondary | ICD-10-CM | POA: Diagnosis not present

## 2017-04-11 DIAGNOSIS — Z8739 Personal history of other diseases of the musculoskeletal system and connective tissue: Secondary | ICD-10-CM | POA: Diagnosis not present

## 2017-04-11 DIAGNOSIS — Z720 Tobacco use: Secondary | ICD-10-CM | POA: Diagnosis not present

## 2017-04-11 DIAGNOSIS — L409 Psoriasis, unspecified: Secondary | ICD-10-CM | POA: Diagnosis not present

## 2017-04-11 DIAGNOSIS — G894 Chronic pain syndrome: Secondary | ICD-10-CM | POA: Diagnosis not present

## 2017-04-11 DIAGNOSIS — D751 Secondary polycythemia: Secondary | ICD-10-CM | POA: Diagnosis not present

## 2017-04-11 DIAGNOSIS — Z79899 Other long term (current) drug therapy: Secondary | ICD-10-CM | POA: Diagnosis not present

## 2017-04-11 DIAGNOSIS — M2550 Pain in unspecified joint: Secondary | ICD-10-CM | POA: Diagnosis not present

## 2017-04-11 DIAGNOSIS — M1990 Unspecified osteoarthritis, unspecified site: Secondary | ICD-10-CM | POA: Diagnosis not present

## 2017-04-11 DIAGNOSIS — Z6824 Body mass index (BMI) 24.0-24.9, adult: Secondary | ICD-10-CM | POA: Diagnosis not present

## 2017-04-11 DIAGNOSIS — I7300 Raynaud's syndrome without gangrene: Secondary | ICD-10-CM | POA: Diagnosis not present

## 2017-04-14 ENCOUNTER — Ambulatory Visit (INDEPENDENT_AMBULATORY_CARE_PROVIDER_SITE_OTHER): Payer: Medicare Other

## 2017-04-14 ENCOUNTER — Ambulatory Visit (INDEPENDENT_AMBULATORY_CARE_PROVIDER_SITE_OTHER): Payer: Medicare Other | Admitting: Family Medicine

## 2017-04-14 ENCOUNTER — Telehealth: Payer: Self-pay | Admitting: Family Medicine

## 2017-04-14 VITALS — BP 149/89 | HR 64 | Temp 98.4°F | Ht 69.0 in | Wt 169.0 lb

## 2017-04-14 DIAGNOSIS — R0602 Shortness of breath: Secondary | ICD-10-CM | POA: Diagnosis not present

## 2017-04-14 DIAGNOSIS — R509 Fever, unspecified: Secondary | ICD-10-CM | POA: Diagnosis not present

## 2017-04-14 DIAGNOSIS — R05 Cough: Secondary | ICD-10-CM | POA: Diagnosis not present

## 2017-04-14 LAB — VERITOR FLU A/B WAIVED
INFLUENZA A: NEGATIVE
Influenza B: NEGATIVE

## 2017-04-14 MED ORDER — IPRATROPIUM-ALBUTEROL 0.5-2.5 (3) MG/3ML IN SOLN
3.0000 mL | Freq: Once | RESPIRATORY_TRACT | Status: AC
  Administered 2017-04-14: 3 mL via RESPIRATORY_TRACT

## 2017-04-14 MED ORDER — AZITHROMYCIN 250 MG PO TABS: ORAL_TABLET | ORAL | 0 refills | Status: DC

## 2017-04-14 NOTE — Telephone Encounter (Signed)
Reviewed Dr Lajuana Ripple recommendation with pt Questions anwered

## 2017-04-14 NOTE — Progress Notes (Signed)
Subjective: CC: flu like illness PCP: Timmothy Euler, MD YOV:ZCHYIFOYD Bridget Young is a 56 y.o. female presenting to clinic today for:  1. Flu like symptoms  Patient reports dry cough, shortness of breath and chest tightness that started about 1 week ago.  She notes that is been worse over the last couple of days.  She notes that she has chills alternating with heat intolerance secondary to lupus.  She was recently started on methotrexate about 6 weeks ago for her lupus.  She has had multiple sick contacts.  Denies hemoptysis, congestion, rhinorrhea, sinus pressure, headache, dizziness, rash, nausea, vomiting, diarrhea, myalgia outside of normal, recent travel.  Patient has used Mucinex w/ little relief of symptoms.  No history of COPD or asthma.  Former tobacco use/ exposure.  She notes that she actually quit 1 week ago when symptoms began.    ROS: Per HPI  Allergies  Allergen Reactions  . Clonidine Derivatives Other (See Comments)   Past Medical History:  Diagnosis Date  . Cancer [facility]) age 68   cervical   . Lupus   . Raynaud's disease     Current Outpatient Medications:  .  BELBUCA 600 MCG FILM, place ONE film INSIDE cheek every 12 hours, Disp: , Rfl: 0 .  Omega-3 Fatty Acids (FISH OIL) 1000 MG CAPS, Take by mouth., Disp: , Rfl:  .  predniSONE (DELTASONE) 10 MG tablet, Take 4 pills a day for 3 days, then 3 pills a day for 3 days, then 2 pills a day for 3 days, then 1 pill a day for 3 days, then stop, Disp: 30 tablet, Rfl: 0 Social History   Socioeconomic History  . Marital status: Divorced    Spouse name: Not on file  . Number of children: Not on file  . Years of education: Not on file  . Highest education level: Not on file  Social Needs  . Financial resource strain: Not on file  . Food insecurity - worry: Not on file  . Food insecurity - inability: Not on file  . Transportation needs - medical: Not on file  . Transportation needs - non-medical: Not on file    Occupational History  . Not on file  Tobacco Use  . Smoking status: Current Every Day Smoker    Packs/day: 0.50    Years: 45.00    Pack years: 22.50  . Smokeless tobacco: Never Used  Substance and Sexual Activity  . Alcohol use: No  . Drug use: No  . Sexual activity: Not on file  Other Topics Concern  . Not on file  Social History Narrative  . Not on file   Family History  Problem Relation Age of Onset  . Cancer Father        lung cancer  . Cancer Sister        breast  . Heart disease Brother     Objective: Office vital signs reviewed. BP (!) 149/89   Pulse 64   Temp 98.4 F (36.9 C) (Oral)   Ht 5\' 9"  (1.753 m)   Wt 169 lb (76.7 kg)   SpO2 94%   BMI 24.96 kg/m    Repeat pulse ox after duoneb 98%.  Physical Examination:  General: Awake, alert, sweating but nontoxic appearing, No acute distress HEENT: Normal    Neck: No masses palpated. No lymphadenopathy    Ears: Tympanic membranes intact, normal light reflex, no erythema, no bulging    Eyes: PERRLA, extraocular membranes intact, sclera white  Nose: nasal turbinates moist, no nasal discharge    Throat: moist mucus membranes, no erythema, no tonsillar exudate.  Airway is patent Cardio: regular rate and rhythm, S1S2 heard, no murmurs appreciated Pulm: Rhonchi appreciated throughout; good air movement; normal work of breathing on room air Skin: warm, clammy  Dg Chest 2 View  Result Date: 04/14/2017 CLINICAL DATA:  Cough and congestion EXAM: CHEST  2 VIEW COMPARISON:  None. FINDINGS: Lungs are clear. Heart size and pulmonary vascularity are normal. No adenopathy. No evident bone lesions. IMPRESSION: No edema or consolidation. Electronically Signed   By: Lowella Grip III M.D.   On: 04/14/2017 16:48   Assessment/ Plan: 56 y.o. female   1. Shortness of breath Patient has normal respiratory rate and oxygen saturation on room air.  Blood pressure slightly elevated.  She is afebrile.  She was sweating on exam  and pulmonary exam was remarkable for intermittent rhonchi throughout all lung fields.  She was given a DuoNeb which improved her pulse ox on room air but did not subjectively improve her symptoms.  Pulmonary exam was unchanged after DuoNeb.  Given her smoking history and symptoms, chest x-ray was obtained which on my read showed slightly prominent vasculature on the right side.  Given her immunocompromised state w/ Methotrexate, I have provided her empiric coverage w/ Azithromycin.  However, I discussed with patient that this may be a Methotrexate induced lung injury, as this can present similar to an infection.  I encouraged her to contact her rheumatologist for instructions, as she is due for Methotrexate tonight.  Will obtain CBC with differential to look for eosinophilia, as this would suggest a methotrexate-induced injury.  Strict return precautions and reasons for emergent evaluation in the emergency department review with patient.  She voiced understanding and will follow-up as needed. - DG Chest 2 View; Future - CBC with Differential - ipratropium-albuterol (DUONEB) 0.5-2.5 (3) MG/3ML nebulizer solution 3 mL  2. Febrile illness Flu was negative. - Veritor Flu A/B Waived - CBC with Differential   Orders Placed This Encounter  Procedures  . DG Chest 2 View    Standing Status:   Future    Number of Occurrences:   1    Standing Expiration Date:   06/15/2018    Order Specific Question:   Reason for Exam (SYMPTOM  OR DIAGNOSIS REQUIRED)    Answer:   SOB, cough. hx smoking.  on methotrexate x6wk for SLE.    Order Specific Question:   Is patient pregnant?    Answer:   No    Order Specific Question:   Preferred imaging location?    Answer:   Internal    Order Specific Question:   Radiology Contrast Protocol - do NOT remove file path    Answer:   [email or web address]  . Veritor Flu A/B Waived    Order Specific Question:   Source    Answer:   nose  .  CBC with Differential   Meds ordered this encounter  Medications  . azithromycin (ZITHROMAX Z-PAK) 250 MG tablet    Sig: As directed    Dispense:  6 tablet    Refill:  0  . ipratropium-albuterol (DUONEB) 0.5-2.5 (3) MG/3ML nebulizer solution 3 mL     Janora Norlander, DO [place] [phone]

## 2017-04-14 NOTE — Patient Instructions (Signed)
Chest x-ray did not demonstrate an obvious pneumonia.  However, given your symptoms I am prescribing you azithromycin to cover for possible pneumonia since you are technically immunocompromised with methotrexate.  If your symptoms do not improve substantially or if they worsen, please seek immediate medical attention.  In some cases, you can get methotrexate-induced lung injuries.  If your symptoms do not improve, I would consider calling your rheumatologist for advice with regards to methotrexate.  You may consider doing a trial off of medication to see if your symptoms improve.  However, I would like your rheumatologist input prior to doing this.  If you develop worsening shortness of breath, worsening cough, high fevers, please seek immediate medical attention in the emergency department.

## 2017-04-15 LAB — CBC WITH DIFFERENTIAL/PLATELET
BASOS: 1 %
Basophils Absolute: 0.1 10*3/uL (ref 0.0–0.2)
EOS (ABSOLUTE): 0.3 10*3/uL (ref 0.0–0.4)
EOS: 4 %
HEMATOCRIT: 44.3 % (ref 34.0–46.6)
HEMOGLOBIN: 15.2 g/dL (ref 11.1–15.9)
Immature Grans (Abs): 0 10*3/uL (ref 0.0–0.1)
Immature Granulocytes: 0 %
LYMPHS ABS: 2.6 10*3/uL (ref 0.7–3.1)
Lymphs: 31 %
MCH: 29.2 pg (ref 26.6–33.0)
MCHC: 34.3 g/dL (ref 31.5–35.7)
MCV: 85 fL (ref 79–97)
MONOCYTES: 8 %
Monocytes Absolute: 0.7 10*3/uL (ref 0.1–0.9)
NEUTROS ABS: 4.8 10*3/uL (ref 1.4–7.0)
Neutrophils: 56 %
Platelets: 283 10*3/uL (ref 150–379)
RBC: 5.2 x10E6/uL (ref 3.77–5.28)
RDW: 14.9 % (ref 12.3–15.4)
WBC: 8.5 10*3/uL (ref 3.4–10.8)

## 2017-05-16 DIAGNOSIS — M533 Sacrococcygeal disorders, not elsewhere classified: Secondary | ICD-10-CM | POA: Diagnosis not present

## 2017-05-16 DIAGNOSIS — M47816 Spondylosis without myelopathy or radiculopathy, lumbar region: Secondary | ICD-10-CM | POA: Diagnosis not present

## 2017-05-16 DIAGNOSIS — M7918 Myalgia, other site: Secondary | ICD-10-CM | POA: Diagnosis not present

## 2017-05-16 DIAGNOSIS — Z79899 Other long term (current) drug therapy: Secondary | ICD-10-CM | POA: Diagnosis not present

## 2017-05-16 DIAGNOSIS — M549 Dorsalgia, unspecified: Secondary | ICD-10-CM | POA: Diagnosis not present

## 2017-05-16 DIAGNOSIS — Z5181 Encounter for therapeutic drug level monitoring: Secondary | ICD-10-CM | POA: Diagnosis not present

## 2017-07-10 DIAGNOSIS — Z720 Tobacco use: Secondary | ICD-10-CM | POA: Diagnosis not present

## 2017-07-10 DIAGNOSIS — D751 Secondary polycythemia: Secondary | ICD-10-CM | POA: Diagnosis not present

## 2017-07-10 DIAGNOSIS — L409 Psoriasis, unspecified: Secondary | ICD-10-CM | POA: Diagnosis not present

## 2017-07-10 DIAGNOSIS — M1990 Unspecified osteoarthritis, unspecified site: Secondary | ICD-10-CM | POA: Diagnosis not present

## 2017-07-10 DIAGNOSIS — Z8739 Personal history of other diseases of the musculoskeletal system and connective tissue: Secondary | ICD-10-CM | POA: Diagnosis not present

## 2017-07-10 DIAGNOSIS — E663 Overweight: Secondary | ICD-10-CM | POA: Diagnosis not present

## 2017-07-10 DIAGNOSIS — G894 Chronic pain syndrome: Secondary | ICD-10-CM | POA: Diagnosis not present

## 2017-07-10 DIAGNOSIS — R5382 Chronic fatigue, unspecified: Secondary | ICD-10-CM | POA: Diagnosis not present

## 2017-07-10 DIAGNOSIS — I7300 Raynaud's syndrome without gangrene: Secondary | ICD-10-CM | POA: Diagnosis not present

## 2017-07-10 DIAGNOSIS — M2550 Pain in unspecified joint: Secondary | ICD-10-CM | POA: Diagnosis not present

## 2017-07-10 DIAGNOSIS — Z6825 Body mass index (BMI) 25.0-25.9, adult: Secondary | ICD-10-CM | POA: Diagnosis not present

## 2017-07-11 DIAGNOSIS — M25511 Pain in right shoulder: Secondary | ICD-10-CM | POA: Diagnosis not present

## 2017-07-11 DIAGNOSIS — M549 Dorsalgia, unspecified: Secondary | ICD-10-CM | POA: Diagnosis not present

## 2017-07-11 DIAGNOSIS — M7918 Myalgia, other site: Secondary | ICD-10-CM | POA: Diagnosis not present

## 2017-07-11 DIAGNOSIS — G894 Chronic pain syndrome: Secondary | ICD-10-CM | POA: Diagnosis not present

## 2017-09-07 DIAGNOSIS — Z79899 Other long term (current) drug therapy: Secondary | ICD-10-CM | POA: Diagnosis not present

## 2017-09-07 DIAGNOSIS — M549 Dorsalgia, unspecified: Secondary | ICD-10-CM | POA: Diagnosis not present

## 2017-09-07 DIAGNOSIS — G894 Chronic pain syndrome: Secondary | ICD-10-CM | POA: Diagnosis not present

## 2017-09-07 DIAGNOSIS — M25511 Pain in right shoulder: Secondary | ICD-10-CM | POA: Diagnosis not present

## 2017-09-07 DIAGNOSIS — M546 Pain in thoracic spine: Secondary | ICD-10-CM | POA: Diagnosis not present

## 2017-09-07 DIAGNOSIS — Z5181 Encounter for therapeutic drug level monitoring: Secondary | ICD-10-CM | POA: Diagnosis not present

## 2017-09-26 ENCOUNTER — Telehealth: Payer: Self-pay | Admitting: Family Medicine

## 2017-10-09 DIAGNOSIS — I7300 Raynaud's syndrome without gangrene: Secondary | ICD-10-CM | POA: Diagnosis not present

## 2017-10-09 DIAGNOSIS — L409 Psoriasis, unspecified: Secondary | ICD-10-CM | POA: Diagnosis not present

## 2017-10-09 DIAGNOSIS — M1990 Unspecified osteoarthritis, unspecified site: Secondary | ICD-10-CM | POA: Diagnosis not present

## 2017-10-09 DIAGNOSIS — M2550 Pain in unspecified joint: Secondary | ICD-10-CM | POA: Diagnosis not present

## 2017-10-09 DIAGNOSIS — Z8739 Personal history of other diseases of the musculoskeletal system and connective tissue: Secondary | ICD-10-CM | POA: Diagnosis not present

## 2017-10-09 DIAGNOSIS — Z6825 Body mass index (BMI) 25.0-25.9, adult: Secondary | ICD-10-CM | POA: Diagnosis not present

## 2017-10-09 DIAGNOSIS — E663 Overweight: Secondary | ICD-10-CM | POA: Diagnosis not present

## 2017-11-02 DIAGNOSIS — M546 Pain in thoracic spine: Secondary | ICD-10-CM | POA: Diagnosis not present

## 2017-11-02 DIAGNOSIS — M549 Dorsalgia, unspecified: Secondary | ICD-10-CM | POA: Diagnosis not present

## 2017-11-02 DIAGNOSIS — M25511 Pain in right shoulder: Secondary | ICD-10-CM | POA: Diagnosis not present

## 2017-11-02 DIAGNOSIS — G894 Chronic pain syndrome: Secondary | ICD-10-CM | POA: Diagnosis not present

## 2017-12-12 DIAGNOSIS — M25552 Pain in left hip: Secondary | ICD-10-CM | POA: Diagnosis not present

## 2017-12-12 DIAGNOSIS — G894 Chronic pain syndrome: Secondary | ICD-10-CM | POA: Diagnosis not present

## 2017-12-12 DIAGNOSIS — M546 Pain in thoracic spine: Secondary | ICD-10-CM | POA: Diagnosis not present

## 2017-12-12 DIAGNOSIS — Z5181 Encounter for therapeutic drug level monitoring: Secondary | ICD-10-CM | POA: Diagnosis not present

## 2017-12-12 DIAGNOSIS — M25511 Pain in right shoulder: Secondary | ICD-10-CM | POA: Diagnosis not present

## 2017-12-12 DIAGNOSIS — Z79899 Other long term (current) drug therapy: Secondary | ICD-10-CM | POA: Diagnosis not present

## 2018-01-10 DIAGNOSIS — M549 Dorsalgia, unspecified: Secondary | ICD-10-CM | POA: Diagnosis not present

## 2018-01-10 DIAGNOSIS — G894 Chronic pain syndrome: Secondary | ICD-10-CM | POA: Diagnosis not present

## 2018-01-10 DIAGNOSIS — M546 Pain in thoracic spine: Secondary | ICD-10-CM | POA: Diagnosis not present

## 2018-01-10 DIAGNOSIS — M25511 Pain in right shoulder: Secondary | ICD-10-CM | POA: Diagnosis not present

## 2018-02-08 DIAGNOSIS — M533 Sacrococcygeal disorders, not elsewhere classified: Secondary | ICD-10-CM | POA: Diagnosis not present

## 2018-03-12 DIAGNOSIS — G894 Chronic pain syndrome: Secondary | ICD-10-CM | POA: Diagnosis not present

## 2018-03-12 DIAGNOSIS — M549 Dorsalgia, unspecified: Secondary | ICD-10-CM | POA: Diagnosis not present

## 2018-03-12 DIAGNOSIS — M25551 Pain in right hip: Secondary | ICD-10-CM | POA: Diagnosis not present

## 2018-03-12 DIAGNOSIS — Z79899 Other long term (current) drug therapy: Secondary | ICD-10-CM | POA: Diagnosis not present

## 2018-03-12 DIAGNOSIS — M25511 Pain in right shoulder: Secondary | ICD-10-CM | POA: Diagnosis not present

## 2018-03-12 DIAGNOSIS — Z5181 Encounter for therapeutic drug level monitoring: Secondary | ICD-10-CM | POA: Diagnosis not present

## 2018-04-09 DIAGNOSIS — M1612 Unilateral primary osteoarthritis, left hip: Secondary | ICD-10-CM | POA: Diagnosis not present

## 2018-04-09 DIAGNOSIS — F17200 Nicotine dependence, unspecified, uncomplicated: Secondary | ICD-10-CM | POA: Diagnosis not present

## 2018-04-09 DIAGNOSIS — M25552 Pain in left hip: Secondary | ICD-10-CM | POA: Diagnosis not present

## 2018-04-09 DIAGNOSIS — G894 Chronic pain syndrome: Secondary | ICD-10-CM | POA: Diagnosis not present

## 2018-04-09 DIAGNOSIS — M329 Systemic lupus erythematosus, unspecified: Secondary | ICD-10-CM | POA: Diagnosis not present

## 2018-04-09 DIAGNOSIS — J45909 Unspecified asthma, uncomplicated: Secondary | ICD-10-CM | POA: Diagnosis not present

## 2018-04-09 DIAGNOSIS — F4310 Post-traumatic stress disorder, unspecified: Secondary | ICD-10-CM | POA: Diagnosis not present

## 2018-04-09 DIAGNOSIS — Z888 Allergy status to other drugs, medicaments and biological substances status: Secondary | ICD-10-CM | POA: Diagnosis not present

## 2018-05-07 DIAGNOSIS — M25511 Pain in right shoulder: Secondary | ICD-10-CM | POA: Diagnosis not present

## 2018-05-07 DIAGNOSIS — M549 Dorsalgia, unspecified: Secondary | ICD-10-CM | POA: Diagnosis not present

## 2018-05-07 DIAGNOSIS — G894 Chronic pain syndrome: Secondary | ICD-10-CM | POA: Diagnosis not present

## 2018-05-07 DIAGNOSIS — M546 Pain in thoracic spine: Secondary | ICD-10-CM | POA: Diagnosis not present

## 2018-05-10 IMAGING — DX DG CHEST 2V
2 series · 2 of 2 positions shown · non-contrast
Comparison: None.

CLINICAL DATA: Cough and congestion

EXAM:
CHEST  2 VIEW

[chest pa]
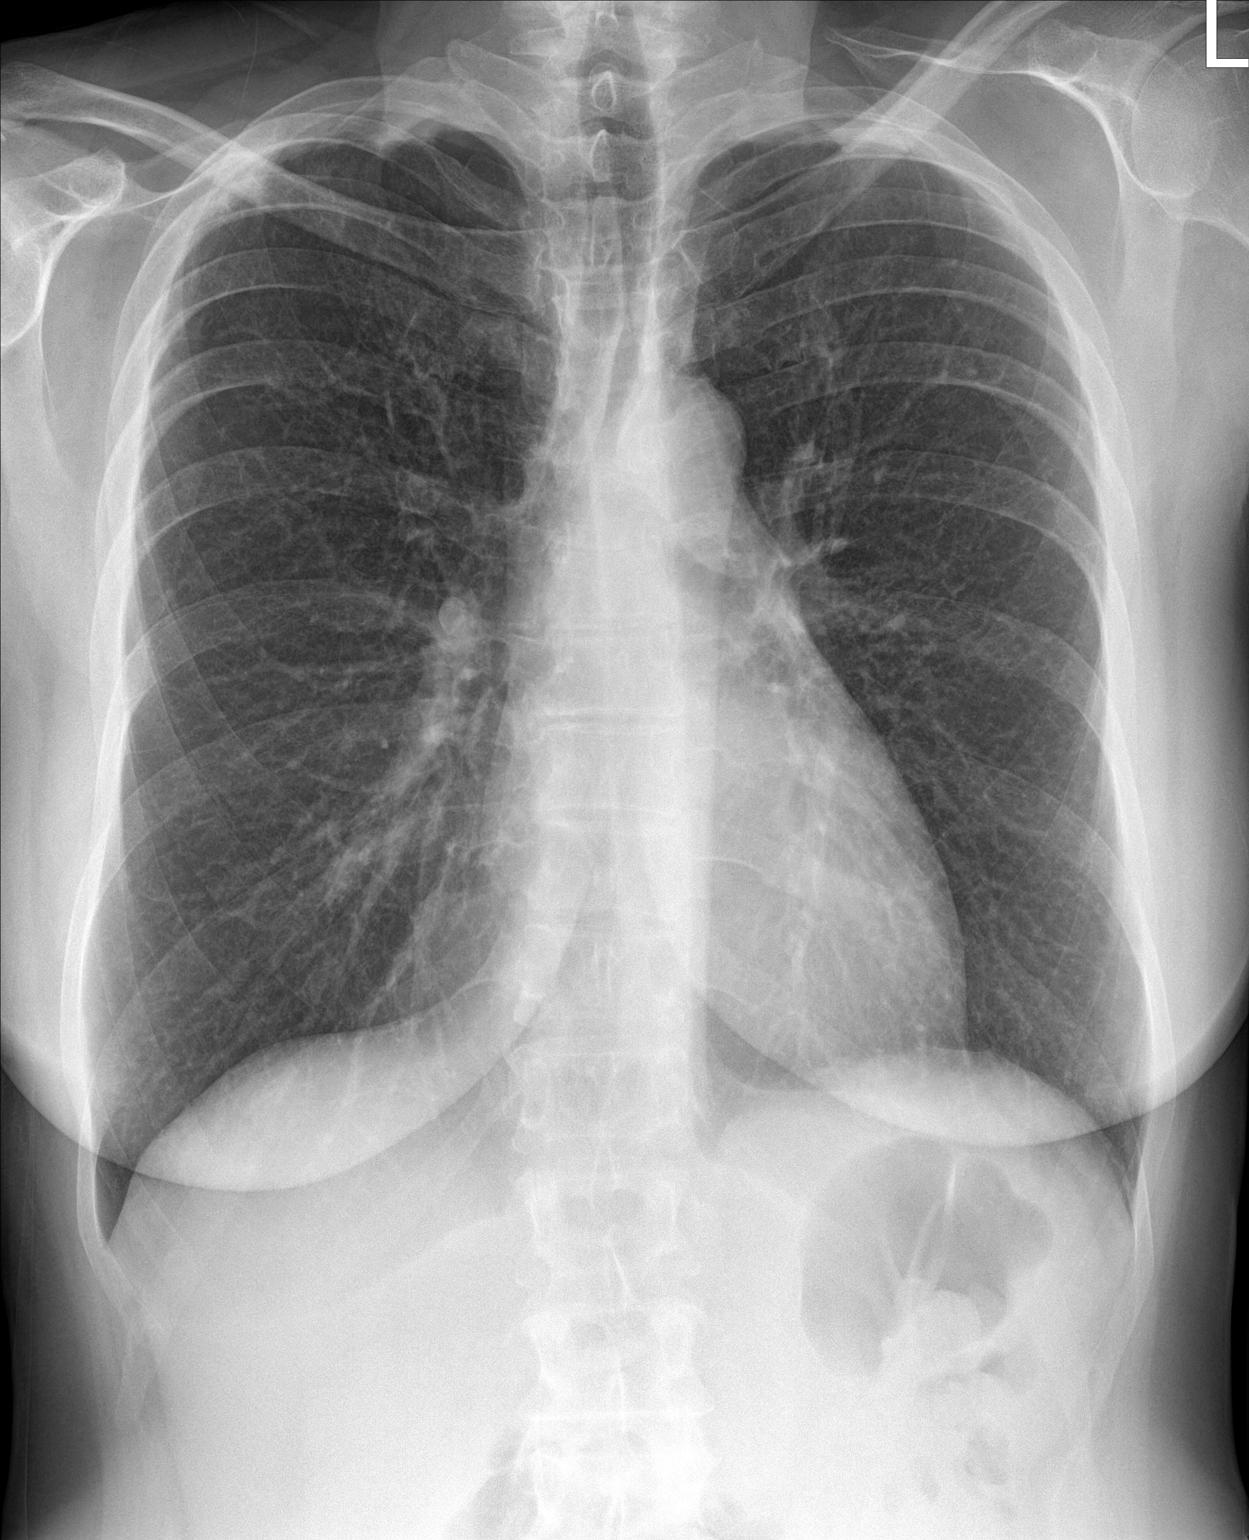

[chest lat]
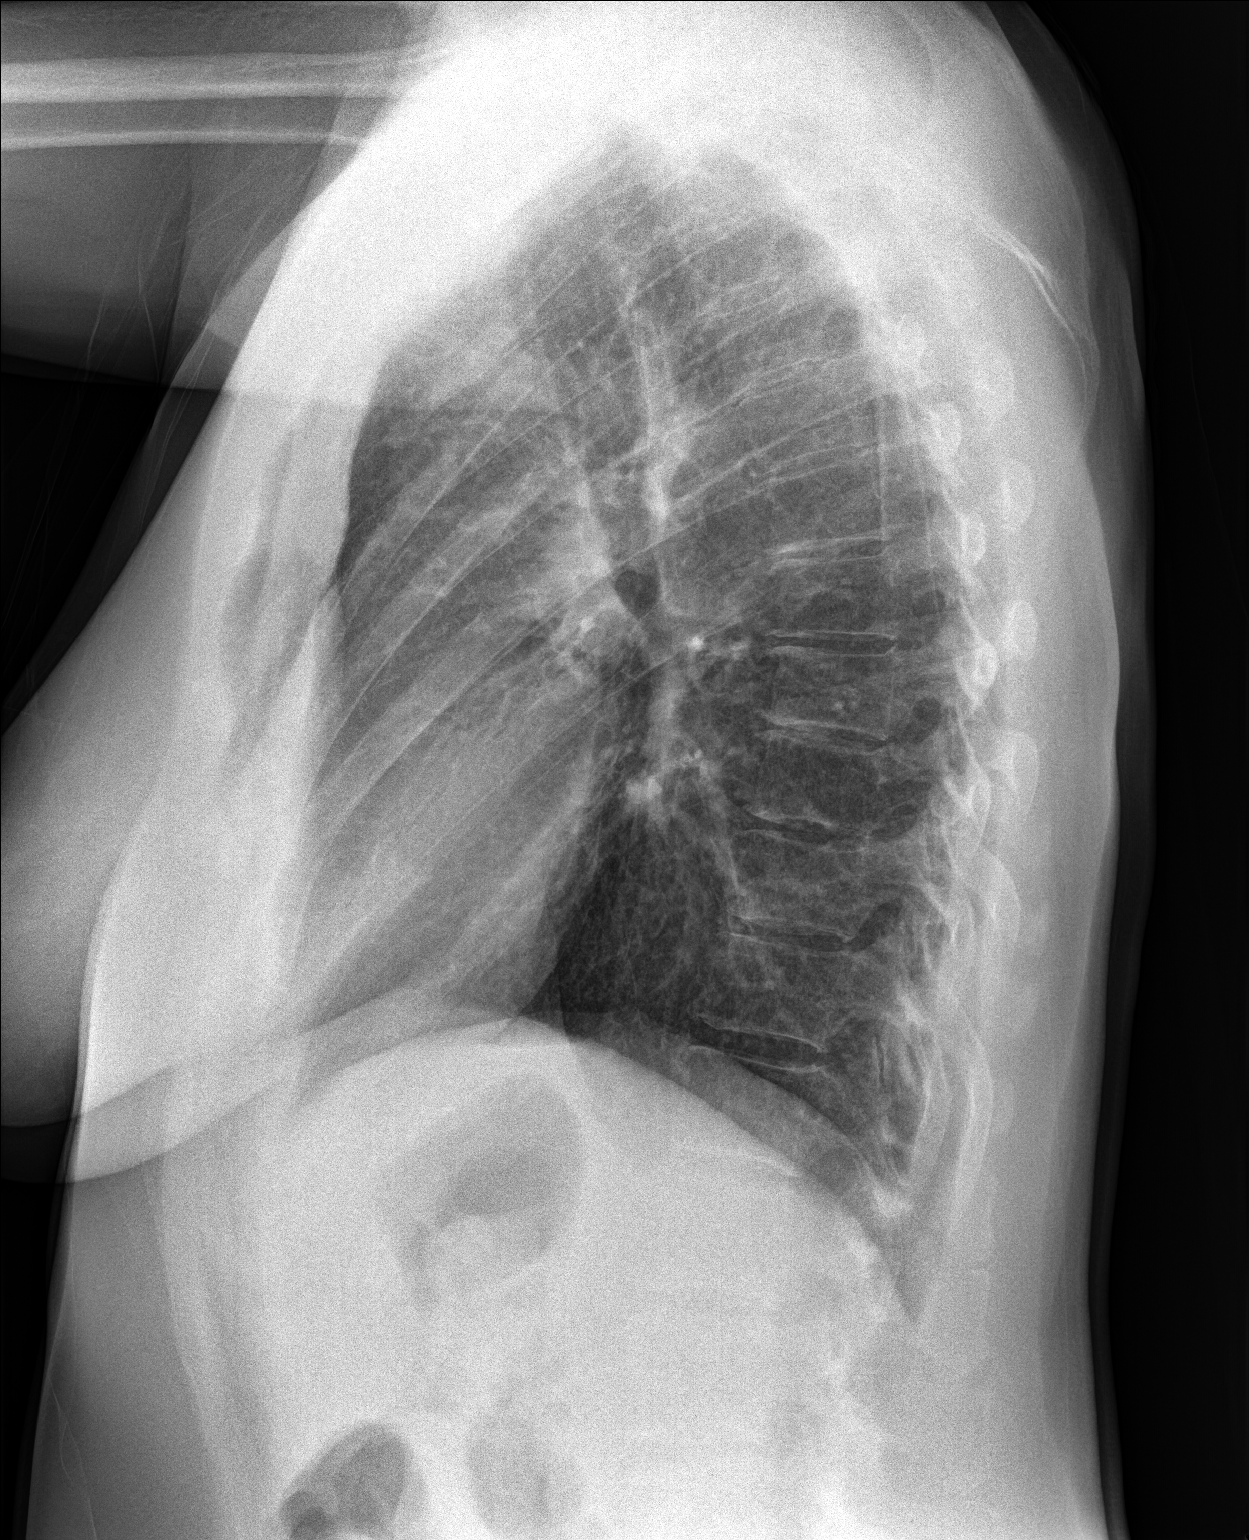

[2 of 2 positions shown; findings below may reference images not displayed]

FINDINGS: Lungs are clear. Heart size and pulmonary vascularity are normal. No
adenopathy. No evident bone lesions.
IMPRESSION: No edema or consolidation.

## 2018-05-14 DIAGNOSIS — Z8541 Personal history of malignant neoplasm of cervix uteri: Secondary | ICD-10-CM | POA: Diagnosis not present

## 2018-05-14 DIAGNOSIS — M1611 Unilateral primary osteoarthritis, right hip: Secondary | ICD-10-CM | POA: Diagnosis not present

## 2018-05-14 DIAGNOSIS — F4310 Post-traumatic stress disorder, unspecified: Secondary | ICD-10-CM | POA: Diagnosis not present

## 2018-05-14 DIAGNOSIS — M25551 Pain in right hip: Secondary | ICD-10-CM | POA: Diagnosis not present

## 2018-05-14 DIAGNOSIS — M329 Systemic lupus erythematosus, unspecified: Secondary | ICD-10-CM | POA: Diagnosis not present

## 2018-05-14 DIAGNOSIS — F17200 Nicotine dependence, unspecified, uncomplicated: Secondary | ICD-10-CM | POA: Diagnosis not present

## 2018-05-14 DIAGNOSIS — F909 Attention-deficit hyperactivity disorder, unspecified type: Secondary | ICD-10-CM | POA: Diagnosis not present

## 2018-05-14 DIAGNOSIS — J45909 Unspecified asthma, uncomplicated: Secondary | ICD-10-CM | POA: Diagnosis not present

## 2018-05-14 DIAGNOSIS — Z888 Allergy status to other drugs, medicaments and biological substances status: Secondary | ICD-10-CM | POA: Diagnosis not present

## 2018-06-25 DIAGNOSIS — M533 Sacrococcygeal disorders, not elsewhere classified: Secondary | ICD-10-CM | POA: Diagnosis not present

## 2018-06-25 DIAGNOSIS — Z5181 Encounter for therapeutic drug level monitoring: Secondary | ICD-10-CM | POA: Diagnosis not present

## 2018-06-25 DIAGNOSIS — M25551 Pain in right hip: Secondary | ICD-10-CM | POA: Diagnosis not present

## 2018-06-25 DIAGNOSIS — M25511 Pain in right shoulder: Secondary | ICD-10-CM | POA: Diagnosis not present

## 2018-06-25 DIAGNOSIS — Z79899 Other long term (current) drug therapy: Secondary | ICD-10-CM | POA: Diagnosis not present

## 2018-06-25 DIAGNOSIS — G894 Chronic pain syndrome: Secondary | ICD-10-CM | POA: Diagnosis not present

## 2018-07-10 DIAGNOSIS — E663 Overweight: Secondary | ICD-10-CM | POA: Diagnosis not present

## 2018-07-10 DIAGNOSIS — I7300 Raynaud's syndrome without gangrene: Secondary | ICD-10-CM | POA: Diagnosis not present

## 2018-07-10 DIAGNOSIS — M1990 Unspecified osteoarthritis, unspecified site: Secondary | ICD-10-CM | POA: Diagnosis not present

## 2018-07-10 DIAGNOSIS — Z8739 Personal history of other diseases of the musculoskeletal system and connective tissue: Secondary | ICD-10-CM | POA: Diagnosis not present

## 2018-07-10 DIAGNOSIS — M2550 Pain in unspecified joint: Secondary | ICD-10-CM | POA: Diagnosis not present

## 2018-07-10 DIAGNOSIS — L409 Psoriasis, unspecified: Secondary | ICD-10-CM | POA: Diagnosis not present

## 2018-07-10 DIAGNOSIS — Z6825 Body mass index (BMI) 25.0-25.9, adult: Secondary | ICD-10-CM | POA: Diagnosis not present

## 2018-08-23 ENCOUNTER — Telehealth: Payer: Self-pay | Admitting: Family

## 2018-08-23 ENCOUNTER — Telehealth: Payer: Self-pay | Admitting: Physician Assistant

## 2018-08-23 DIAGNOSIS — J302 Other seasonal allergic rhinitis: Secondary | ICD-10-CM

## 2018-08-23 DIAGNOSIS — Z9189 Other specified personal risk factors, not elsewhere classified: Secondary | ICD-10-CM

## 2018-08-23 DIAGNOSIS — W57XXXA Bitten or stung by nonvenomous insect and other nonvenomous arthropods, initial encounter: Secondary | ICD-10-CM

## 2018-08-23 MED ORDER — DOXYCYCLINE HYCLATE 100 MG PO CAPS: 100.0000 mg | ORAL_CAPSULE | Freq: Two times a day (BID) | ORAL | 0 refills | Status: DC

## 2018-08-23 NOTE — Progress Notes (Signed)
I am not sure if she has concerns of allergies or tick bite. Message sent for clarity

## 2018-08-23 NOTE — Progress Notes (Signed)
Thank you for describing your tick bite, Here is how we plan to help! Based on the information that you shared with me it looks like you have A tick that bite that we will treat with a short course of doxycycline. Giving you are currently on methotrexate I want you to call your Rheumatologist to let him know you are taking Doxycycline for suspected tick-borne illness so he can adjust the dosing of the Methotrexate.  In most cases a tick bite is painless and does not itch.  Most tick bites in which the tick is quickly removed do not require prescriptions. Ticks can transmit several diseases if they are infected and remain attacked to your skin. Therefore the length that the tick was attached and any symptoms you have experienced after the bite are import to accurately develop your custom treatment plan. In most cases a single dose of doxycycline may prevent the development of a more serious condition.  Based on your information I have Provided a home care guide for tick bites  and  instructions on when to call for help. and Your symptoms indicate that you need a longer course of antibiotics and a follow up visit with a provider. I have sent doxycycline 100 mg twice a day for 21 days to the pharmacy that you selected. You will need to schedule a follow up visit with your provider. If you do not have a primary care provider you may use our telehealth physicians on the web at [place].  Which ticks  are associated with illness?  The Wood Tick (dog tick) is the size of a watermelon seed and can sometimes transmit [facility] spotted fever and [place] tick fever.   The Deer Tick (black-legged tick) is between the size of a poppy seed (pin head) and an apple seed, and can sometimes transmit Lyme disease.  A brown to black tick with a white splotch on its back is likely a female Amblyomma americanum ([place] tick). This tick has been associated with Southern Tick Associated illness (  STARI)  Lyme disease has become the most common tick-borne illness in the [place]. The risk of Lyme disease following a recognized deer tick bite is estimated to be 1%.  The majority of cases of Lyme disease start with a bull's eye rash at the site of the tick bite. The rash can occur days to weeks (typically 7-10 days) after a tick bite. Treatment with antibiotics is indicated if this rash appears. Flu-like symptoms may accompany the rash, including: fever, chills, headaches, muscle aches, and fatigue. Removing ticks promptly may prevent tick borne disease.  What can be used to prevent Tick Bites?   Insect repellant with at leas 20% DEET.  Wearing long pants with sock and shoes.  Avoiding tall grass and heavily wooded areas.  Checking your skin after being outdoors.  Shower with a washcloth after outdoor exposures.  HOME CARE ADVICE FOR TICK BITE  1. Wood Tick Removal:  o Use a pair of tweezers and grasp the wood tick close to the skin (on its head). Pull the wood tick straight upward without twisting or crushing it. Maintain a steady pressure until it releases its grip.   o If tweezers aren't available, use fingers, a loop of thread around the jaws, or a needle between the jaws for traction.  o Note: covering the tick with petroleum jelly, nail polish or rubbing alcohol doesn't work. Neither does touching the tick with a hot or cold object. 2.  Tiny Deer Tick Removal:   o Needs to be scraped off with a knife blade or credit card edge. o Place tick in a sealed container (e.g. glass jar, zip lock plastic bag), in case your doctor wants to see it. 3. Tick's Head Removal:  o If the wood tick's head breaks off in the skin, it must be removed. Clean the skin. Then use a sterile needle to uncover the head and lift it out or scrape it off.  o If a very small piece of the head remains, the skin will eventually slough it off. 4. Antibiotic Ointment:  o Wash the wound and your hands with  soap and water after removal to prevent catching any tick disease.  Apply an over the counter antibiotic ointment (e.g. bacitracin) to the bite once. 5. Expected Course: Tick bites normally don't itch or hurt. That's why they often go unnoticed. 6. Call Your Doctor If:  o You can't remove the tick or the tick's head o Fever, a severe head ache, or rash occur in the next 2 weeks o Bite begins to look infected o Lyme's disease is common in your area o You have not had a tetanus in the last 10 years o Your current symptoms become worse    MAKE SURE YOU   Understand these instructions.  Will watch your condition.  Will get help right away if you are not doing well or get worse.   Thank you for choosing an e-visit.  Your e-visit answers were reviewed by a board certified advanced clinical practitioner to complete your personal care plan. Depending upon the condition, your plan could have included both over the counter or prescription medications. Please review your pharmacy choice. If there is a problem you may use MyChart messaging to have the prescription routed to another pharmacy. Your safety is important to [place]. If you have drug allergies check your prescription carefully.   You can use MyChart to ask questions about today's visit, request a non-urgent call back, or ask for a work or school excuse for 24 hours related to this e-Visit. If it has been greater than 24 hours you will need to follow up with your provider, or enter a new e-Visit to address those concerns.  You will get an email in the next two days asking about your experience. I hope  that your e-visit has been valuable and will speed your recovery

## 2018-08-23 NOTE — Progress Notes (Signed)
Message sent to patient for clarification on current symptoms. She did e-visit this morning in which the provider requested she submit e-visit questionnaire for tick bite as she seems to have potential allergy system (versus allergic reaction to bite) in conjunction with the tick bite itself and potential cellulitis.   Awaiting response.

## 2018-08-23 NOTE — Progress Notes (Signed)
I have spent 5 minutes in review of e-visit questionnaire, review and updating patient chart, medical decision making and response to patient.   Ethelean Colla Cody Jalene Lacko, PA-C    

## 2018-08-29 DIAGNOSIS — M546 Pain in thoracic spine: Secondary | ICD-10-CM | POA: Diagnosis not present

## 2018-08-29 DIAGNOSIS — G894 Chronic pain syndrome: Secondary | ICD-10-CM | POA: Diagnosis not present

## 2018-08-29 DIAGNOSIS — M25551 Pain in right hip: Secondary | ICD-10-CM | POA: Diagnosis not present

## 2018-08-29 DIAGNOSIS — M533 Sacrococcygeal disorders, not elsewhere classified: Secondary | ICD-10-CM | POA: Diagnosis not present

## 2018-10-10 DIAGNOSIS — M25551 Pain in right hip: Secondary | ICD-10-CM | POA: Diagnosis not present

## 2018-10-10 DIAGNOSIS — M1612 Unilateral primary osteoarthritis, left hip: Secondary | ICD-10-CM | POA: Diagnosis not present

## 2018-10-10 DIAGNOSIS — M533 Sacrococcygeal disorders, not elsewhere classified: Secondary | ICD-10-CM | POA: Diagnosis not present

## 2018-10-10 DIAGNOSIS — G894 Chronic pain syndrome: Secondary | ICD-10-CM | POA: Diagnosis not present

## 2018-10-26 NOTE — Progress Notes (Signed)
Greater than 5 minutes, yet less than 10 minutes of time have been spent researching, coordinating, and implementing care for this patient today.  Thank you for the details you included in the comment boxes. Those details are very helpful in determining the best course of treatment for you and help us to provide the best care.  

## 2018-10-31 DIAGNOSIS — G894 Chronic pain syndrome: Secondary | ICD-10-CM | POA: Diagnosis not present

## 2018-10-31 DIAGNOSIS — F909 Attention-deficit hyperactivity disorder, unspecified type: Secondary | ICD-10-CM | POA: Diagnosis not present

## 2018-10-31 DIAGNOSIS — J45909 Unspecified asthma, uncomplicated: Secondary | ICD-10-CM | POA: Diagnosis not present

## 2018-10-31 DIAGNOSIS — F17200 Nicotine dependence, unspecified, uncomplicated: Secondary | ICD-10-CM | POA: Diagnosis not present

## 2018-10-31 DIAGNOSIS — M329 Systemic lupus erythematosus, unspecified: Secondary | ICD-10-CM | POA: Diagnosis not present

## 2018-10-31 DIAGNOSIS — Z888 Allergy status to other drugs, medicaments and biological substances status: Secondary | ICD-10-CM | POA: Diagnosis not present

## 2018-10-31 DIAGNOSIS — F4310 Post-traumatic stress disorder, unspecified: Secondary | ICD-10-CM | POA: Diagnosis not present

## 2018-10-31 DIAGNOSIS — M25552 Pain in left hip: Secondary | ICD-10-CM | POA: Diagnosis not present

## 2018-10-31 DIAGNOSIS — M1612 Unilateral primary osteoarthritis, left hip: Secondary | ICD-10-CM | POA: Diagnosis not present

## 2018-11-22 DIAGNOSIS — M25551 Pain in right hip: Secondary | ICD-10-CM | POA: Diagnosis not present

## 2018-11-22 DIAGNOSIS — G894 Chronic pain syndrome: Secondary | ICD-10-CM | POA: Diagnosis not present

## 2018-11-22 DIAGNOSIS — M549 Dorsalgia, unspecified: Secondary | ICD-10-CM | POA: Diagnosis not present

## 2018-11-22 DIAGNOSIS — M170 Bilateral primary osteoarthritis of knee: Secondary | ICD-10-CM | POA: Diagnosis not present

## 2018-12-20 DIAGNOSIS — G894 Chronic pain syndrome: Secondary | ICD-10-CM | POA: Diagnosis not present

## 2019-01-31 DIAGNOSIS — G894 Chronic pain syndrome: Secondary | ICD-10-CM | POA: Diagnosis not present

## 2019-02-27 DIAGNOSIS — Z5181 Encounter for therapeutic drug level monitoring: Secondary | ICD-10-CM | POA: Diagnosis not present

## 2019-02-27 DIAGNOSIS — Z79899 Other long term (current) drug therapy: Secondary | ICD-10-CM | POA: Diagnosis not present

## 2019-03-04 DIAGNOSIS — G894 Chronic pain syndrome: Secondary | ICD-10-CM | POA: Diagnosis not present

## 2019-05-29 DIAGNOSIS — G894 Chronic pain syndrome: Secondary | ICD-10-CM | POA: Diagnosis not present

## 2019-05-29 DIAGNOSIS — M533 Sacrococcygeal disorders, not elsewhere classified: Secondary | ICD-10-CM | POA: Diagnosis not present

## 2019-05-29 DIAGNOSIS — M170 Bilateral primary osteoarthritis of knee: Secondary | ICD-10-CM | POA: Diagnosis not present

## 2019-05-29 DIAGNOSIS — M25551 Pain in right hip: Secondary | ICD-10-CM | POA: Diagnosis not present

## 2019-05-29 DIAGNOSIS — Z79899 Other long term (current) drug therapy: Secondary | ICD-10-CM | POA: Diagnosis not present

## 2019-05-29 DIAGNOSIS — Z5181 Encounter for therapeutic drug level monitoring: Secondary | ICD-10-CM | POA: Diagnosis not present

## 2019-06-03 DIAGNOSIS — G894 Chronic pain syndrome: Secondary | ICD-10-CM | POA: Diagnosis not present

## 2019-06-03 DIAGNOSIS — M329 Systemic lupus erythematosus, unspecified: Secondary | ICD-10-CM | POA: Diagnosis not present

## 2019-06-03 DIAGNOSIS — M1632 Unilateral osteoarthritis resulting from hip dysplasia, left hip: Secondary | ICD-10-CM | POA: Diagnosis not present

## 2019-06-03 DIAGNOSIS — F17210 Nicotine dependence, cigarettes, uncomplicated: Secondary | ICD-10-CM | POA: Diagnosis not present

## 2019-06-03 DIAGNOSIS — M1612 Unilateral primary osteoarthritis, left hip: Secondary | ICD-10-CM | POA: Diagnosis not present

## 2019-06-03 DIAGNOSIS — M25552 Pain in left hip: Secondary | ICD-10-CM | POA: Diagnosis not present

## 2019-06-03 DIAGNOSIS — L409 Psoriasis, unspecified: Secondary | ICD-10-CM | POA: Diagnosis not present

## 2019-06-17 DIAGNOSIS — M329 Systemic lupus erythematosus, unspecified: Secondary | ICD-10-CM | POA: Diagnosis not present

## 2019-06-17 DIAGNOSIS — F909 Attention-deficit hyperactivity disorder, unspecified type: Secondary | ICD-10-CM | POA: Diagnosis not present

## 2019-06-17 DIAGNOSIS — F17200 Nicotine dependence, unspecified, uncomplicated: Secondary | ICD-10-CM | POA: Diagnosis not present

## 2019-06-17 DIAGNOSIS — M25551 Pain in right hip: Secondary | ICD-10-CM | POA: Diagnosis not present

## 2019-06-17 DIAGNOSIS — M1611 Unilateral primary osteoarthritis, right hip: Secondary | ICD-10-CM | POA: Diagnosis not present

## 2019-06-17 DIAGNOSIS — J45909 Unspecified asthma, uncomplicated: Secondary | ICD-10-CM | POA: Diagnosis not present

## 2019-07-24 DIAGNOSIS — M25551 Pain in right hip: Secondary | ICD-10-CM | POA: Diagnosis not present

## 2019-07-24 DIAGNOSIS — M170 Bilateral primary osteoarthritis of knee: Secondary | ICD-10-CM | POA: Diagnosis not present

## 2019-07-24 DIAGNOSIS — G894 Chronic pain syndrome: Secondary | ICD-10-CM | POA: Diagnosis not present

## 2019-07-24 DIAGNOSIS — M533 Sacrococcygeal disorders, not elsewhere classified: Secondary | ICD-10-CM | POA: Diagnosis not present

## 2019-08-01 DIAGNOSIS — M533 Sacrococcygeal disorders, not elsewhere classified: Secondary | ICD-10-CM | POA: Diagnosis not present

## 2019-08-01 DIAGNOSIS — M461 Sacroiliitis, not elsewhere classified: Secondary | ICD-10-CM | POA: Diagnosis not present

## 2019-09-16 DIAGNOSIS — Z8739 Personal history of other diseases of the musculoskeletal system and connective tissue: Secondary | ICD-10-CM | POA: Diagnosis not present

## 2019-09-16 DIAGNOSIS — I7300 Raynaud's syndrome without gangrene: Secondary | ICD-10-CM | POA: Diagnosis not present

## 2019-09-16 DIAGNOSIS — I781 Nevus, non-neoplastic: Secondary | ICD-10-CM | POA: Diagnosis not present

## 2019-09-16 DIAGNOSIS — M1990 Unspecified osteoarthritis, unspecified site: Secondary | ICD-10-CM | POA: Diagnosis not present

## 2019-09-16 DIAGNOSIS — M2550 Pain in unspecified joint: Secondary | ICD-10-CM | POA: Diagnosis not present

## 2019-09-16 DIAGNOSIS — Z6824 Body mass index (BMI) 24.0-24.9, adult: Secondary | ICD-10-CM | POA: Diagnosis not present

## 2019-09-16 DIAGNOSIS — L409 Psoriasis, unspecified: Secondary | ICD-10-CM | POA: Diagnosis not present

## 2019-09-18 DIAGNOSIS — M170 Bilateral primary osteoarthritis of knee: Secondary | ICD-10-CM | POA: Diagnosis not present

## 2019-09-18 DIAGNOSIS — Z5181 Encounter for therapeutic drug level monitoring: Secondary | ICD-10-CM | POA: Diagnosis not present

## 2019-09-18 DIAGNOSIS — M533 Sacrococcygeal disorders, not elsewhere classified: Secondary | ICD-10-CM | POA: Diagnosis not present

## 2019-09-18 DIAGNOSIS — M25551 Pain in right hip: Secondary | ICD-10-CM | POA: Diagnosis not present

## 2019-09-18 DIAGNOSIS — Z79899 Other long term (current) drug therapy: Secondary | ICD-10-CM | POA: Diagnosis not present

## 2019-09-18 DIAGNOSIS — G894 Chronic pain syndrome: Secondary | ICD-10-CM | POA: Diagnosis not present

## 2019-09-26 DIAGNOSIS — M461 Sacroiliitis, not elsewhere classified: Secondary | ICD-10-CM | POA: Diagnosis not present

## 2019-10-10 DIAGNOSIS — M533 Sacrococcygeal disorders, not elsewhere classified: Secondary | ICD-10-CM | POA: Diagnosis not present

## 2019-10-10 DIAGNOSIS — M25551 Pain in right hip: Secondary | ICD-10-CM | POA: Diagnosis not present

## 2019-10-10 DIAGNOSIS — G894 Chronic pain syndrome: Secondary | ICD-10-CM | POA: Diagnosis not present

## 2019-10-10 DIAGNOSIS — M170 Bilateral primary osteoarthritis of knee: Secondary | ICD-10-CM | POA: Diagnosis not present

## 2019-10-23 DIAGNOSIS — M461 Sacroiliitis, not elsewhere classified: Secondary | ICD-10-CM | POA: Diagnosis not present

## 2019-11-14 DIAGNOSIS — G894 Chronic pain syndrome: Secondary | ICD-10-CM | POA: Diagnosis not present

## 2019-11-14 DIAGNOSIS — M533 Sacrococcygeal disorders, not elsewhere classified: Secondary | ICD-10-CM | POA: Diagnosis not present

## 2019-11-14 DIAGNOSIS — M25551 Pain in right hip: Secondary | ICD-10-CM | POA: Diagnosis not present

## 2019-11-14 DIAGNOSIS — M170 Bilateral primary osteoarthritis of knee: Secondary | ICD-10-CM | POA: Diagnosis not present

## 2020-01-08 DIAGNOSIS — M461 Sacroiliitis, not elsewhere classified: Secondary | ICD-10-CM | POA: Diagnosis not present

## 2020-01-08 DIAGNOSIS — M25551 Pain in right hip: Secondary | ICD-10-CM | POA: Diagnosis not present

## 2020-02-17 DIAGNOSIS — M533 Sacrococcygeal disorders, not elsewhere classified: Secondary | ICD-10-CM | POA: Diagnosis not present

## 2020-02-17 DIAGNOSIS — M170 Bilateral primary osteoarthritis of knee: Secondary | ICD-10-CM | POA: Diagnosis not present

## 2020-02-17 DIAGNOSIS — M25552 Pain in left hip: Secondary | ICD-10-CM | POA: Diagnosis not present

## 2020-02-17 DIAGNOSIS — M25551 Pain in right hip: Secondary | ICD-10-CM | POA: Diagnosis not present

## 2020-02-17 DIAGNOSIS — M546 Pain in thoracic spine: Secondary | ICD-10-CM | POA: Diagnosis not present

## 2020-02-17 DIAGNOSIS — M545 Low back pain, unspecified: Secondary | ICD-10-CM | POA: Diagnosis not present

## 2020-02-17 DIAGNOSIS — G894 Chronic pain syndrome: Secondary | ICD-10-CM | POA: Diagnosis not present

## 2020-02-17 DIAGNOSIS — M25512 Pain in left shoulder: Secondary | ICD-10-CM | POA: Diagnosis not present

## 2020-02-17 DIAGNOSIS — M25511 Pain in right shoulder: Secondary | ICD-10-CM | POA: Diagnosis not present

## 2020-02-17 DIAGNOSIS — G8929 Other chronic pain: Secondary | ICD-10-CM | POA: Diagnosis not present

## 2020-02-17 DIAGNOSIS — Z5181 Encounter for therapeutic drug level monitoring: Secondary | ICD-10-CM | POA: Diagnosis not present

## 2020-02-17 DIAGNOSIS — Z79899 Other long term (current) drug therapy: Secondary | ICD-10-CM | POA: Diagnosis not present

## 2020-02-19 ENCOUNTER — Other Ambulatory Visit: Payer: Self-pay | Admitting: Anesthesiology

## 2020-02-19 DIAGNOSIS — M545 Low back pain, unspecified: Secondary | ICD-10-CM

## 2020-03-08 ENCOUNTER — Ambulatory Visit
Admission: RE | Admit: 2020-03-08 | Discharge: 2020-03-08 | Disposition: A | Discharge status: Home / Self Care | Payer: Medicare Other | Source: Ambulatory Visit | Attending: Anesthesiology | Admitting: Anesthesiology

## 2020-03-08 DIAGNOSIS — M545 Low back pain, unspecified: Secondary | ICD-10-CM | POA: Diagnosis not present

## 2020-03-08 DIAGNOSIS — M48061 Spinal stenosis, lumbar region without neurogenic claudication: Secondary | ICD-10-CM | POA: Diagnosis not present

## 2020-04-13 DIAGNOSIS — M25551 Pain in right hip: Secondary | ICD-10-CM | POA: Diagnosis not present

## 2020-04-13 DIAGNOSIS — G894 Chronic pain syndrome: Secondary | ICD-10-CM | POA: Diagnosis not present

## 2020-04-13 DIAGNOSIS — M533 Sacrococcygeal disorders, not elsewhere classified: Secondary | ICD-10-CM | POA: Diagnosis not present

## 2020-04-13 DIAGNOSIS — M170 Bilateral primary osteoarthritis of knee: Secondary | ICD-10-CM | POA: Diagnosis not present

## 2020-06-08 DIAGNOSIS — M1611 Unilateral primary osteoarthritis, right hip: Secondary | ICD-10-CM | POA: Diagnosis not present

## 2020-06-08 DIAGNOSIS — M25552 Pain in left hip: Secondary | ICD-10-CM | POA: Diagnosis not present

## 2020-06-08 DIAGNOSIS — M25551 Pain in right hip: Secondary | ICD-10-CM | POA: Diagnosis not present

## 2020-06-08 DIAGNOSIS — M1612 Unilateral primary osteoarthritis, left hip: Secondary | ICD-10-CM | POA: Diagnosis not present

## 2020-06-23 DIAGNOSIS — G8929 Other chronic pain: Secondary | ICD-10-CM | POA: Diagnosis not present

## 2020-06-23 DIAGNOSIS — Z79899 Other long term (current) drug therapy: Secondary | ICD-10-CM | POA: Diagnosis not present

## 2020-06-23 DIAGNOSIS — M170 Bilateral primary osteoarthritis of knee: Secondary | ICD-10-CM | POA: Diagnosis not present

## 2020-06-23 DIAGNOSIS — M25552 Pain in left hip: Secondary | ICD-10-CM | POA: Diagnosis not present

## 2020-06-23 DIAGNOSIS — G894 Chronic pain syndrome: Secondary | ICD-10-CM | POA: Diagnosis not present

## 2020-06-23 DIAGNOSIS — M546 Pain in thoracic spine: Secondary | ICD-10-CM | POA: Diagnosis not present

## 2020-06-23 DIAGNOSIS — M25512 Pain in left shoulder: Secondary | ICD-10-CM | POA: Diagnosis not present

## 2020-06-23 DIAGNOSIS — M533 Sacrococcygeal disorders, not elsewhere classified: Secondary | ICD-10-CM | POA: Diagnosis not present

## 2020-06-23 DIAGNOSIS — M25511 Pain in right shoulder: Secondary | ICD-10-CM | POA: Diagnosis not present

## 2020-06-23 DIAGNOSIS — M25551 Pain in right hip: Secondary | ICD-10-CM | POA: Diagnosis not present

## 2020-06-23 DIAGNOSIS — Z5181 Encounter for therapeutic drug level monitoring: Secondary | ICD-10-CM | POA: Diagnosis not present

## 2020-07-02 DIAGNOSIS — M47816 Spondylosis without myelopathy or radiculopathy, lumbar region: Secondary | ICD-10-CM | POA: Diagnosis not present

## 2020-07-21 DIAGNOSIS — M47816 Spondylosis without myelopathy or radiculopathy, lumbar region: Secondary | ICD-10-CM | POA: Diagnosis not present

## 2020-09-24 DIAGNOSIS — Z79899 Other long term (current) drug therapy: Secondary | ICD-10-CM | POA: Diagnosis not present

## 2020-09-24 DIAGNOSIS — M47816 Spondylosis without myelopathy or radiculopathy, lumbar region: Secondary | ICD-10-CM | POA: Diagnosis not present

## 2020-09-24 DIAGNOSIS — G8929 Other chronic pain: Secondary | ICD-10-CM | POA: Diagnosis not present

## 2020-09-24 DIAGNOSIS — M25551 Pain in right hip: Secondary | ICD-10-CM | POA: Diagnosis not present

## 2020-09-24 DIAGNOSIS — M533 Sacrococcygeal disorders, not elsewhere classified: Secondary | ICD-10-CM | POA: Diagnosis not present

## 2020-09-24 DIAGNOSIS — Z5181 Encounter for therapeutic drug level monitoring: Secondary | ICD-10-CM | POA: Diagnosis not present

## 2020-09-24 DIAGNOSIS — M546 Pain in thoracic spine: Secondary | ICD-10-CM | POA: Diagnosis not present

## 2020-09-24 DIAGNOSIS — M545 Low back pain, unspecified: Secondary | ICD-10-CM | POA: Diagnosis not present

## 2020-09-24 DIAGNOSIS — G894 Chronic pain syndrome: Secondary | ICD-10-CM | POA: Diagnosis not present

## 2020-09-24 DIAGNOSIS — M25552 Pain in left hip: Secondary | ICD-10-CM | POA: Diagnosis not present

## 2020-09-24 DIAGNOSIS — M170 Bilateral primary osteoarthritis of knee: Secondary | ICD-10-CM | POA: Diagnosis not present

## 2020-09-24 DIAGNOSIS — M25511 Pain in right shoulder: Secondary | ICD-10-CM | POA: Diagnosis not present

## 2020-09-24 DIAGNOSIS — M25512 Pain in left shoulder: Secondary | ICD-10-CM | POA: Diagnosis not present

## 2020-10-14 DIAGNOSIS — R059 Cough, unspecified: Secondary | ICD-10-CM | POA: Diagnosis not present

## 2020-10-14 DIAGNOSIS — J029 Acute pharyngitis, unspecified: Secondary | ICD-10-CM | POA: Diagnosis not present

## 2020-10-14 DIAGNOSIS — J329 Chronic sinusitis, unspecified: Secondary | ICD-10-CM | POA: Diagnosis not present

## 2020-11-18 DIAGNOSIS — G894 Chronic pain syndrome: Secondary | ICD-10-CM | POA: Diagnosis not present

## 2020-11-18 DIAGNOSIS — M25512 Pain in left shoulder: Secondary | ICD-10-CM | POA: Diagnosis not present

## 2020-11-18 DIAGNOSIS — M25552 Pain in left hip: Secondary | ICD-10-CM | POA: Diagnosis not present

## 2020-11-18 DIAGNOSIS — G8929 Other chronic pain: Secondary | ICD-10-CM | POA: Diagnosis not present

## 2020-11-18 DIAGNOSIS — M546 Pain in thoracic spine: Secondary | ICD-10-CM | POA: Diagnosis not present

## 2020-11-18 DIAGNOSIS — M170 Bilateral primary osteoarthritis of knee: Secondary | ICD-10-CM | POA: Diagnosis not present

## 2020-11-18 DIAGNOSIS — M533 Sacrococcygeal disorders, not elsewhere classified: Secondary | ICD-10-CM | POA: Diagnosis not present

## 2020-11-18 DIAGNOSIS — M25511 Pain in right shoulder: Secondary | ICD-10-CM | POA: Diagnosis not present

## 2020-11-18 DIAGNOSIS — M47816 Spondylosis without myelopathy or radiculopathy, lumbar region: Secondary | ICD-10-CM | POA: Diagnosis not present

## 2020-11-18 DIAGNOSIS — M25551 Pain in right hip: Secondary | ICD-10-CM | POA: Diagnosis not present

## 2020-11-18 DIAGNOSIS — M545 Low back pain, unspecified: Secondary | ICD-10-CM | POA: Diagnosis not present

## 2020-12-18 DIAGNOSIS — R42 Dizziness and giddiness: Secondary | ICD-10-CM | POA: Diagnosis not present

## 2020-12-18 DIAGNOSIS — R110 Nausea: Secondary | ICD-10-CM | POA: Diagnosis not present

## 2021-01-13 DIAGNOSIS — M25512 Pain in left shoulder: Secondary | ICD-10-CM | POA: Diagnosis not present

## 2021-01-13 DIAGNOSIS — Z5181 Encounter for therapeutic drug level monitoring: Secondary | ICD-10-CM | POA: Diagnosis not present

## 2021-01-13 DIAGNOSIS — G8929 Other chronic pain: Secondary | ICD-10-CM | POA: Diagnosis not present

## 2021-01-13 DIAGNOSIS — M47816 Spondylosis without myelopathy or radiculopathy, lumbar region: Secondary | ICD-10-CM | POA: Diagnosis not present

## 2021-01-13 DIAGNOSIS — M546 Pain in thoracic spine: Secondary | ICD-10-CM | POA: Diagnosis not present

## 2021-01-13 DIAGNOSIS — M545 Low back pain, unspecified: Secondary | ICD-10-CM | POA: Diagnosis not present

## 2021-01-13 DIAGNOSIS — Z79899 Other long term (current) drug therapy: Secondary | ICD-10-CM | POA: Diagnosis not present

## 2021-01-13 DIAGNOSIS — M25552 Pain in left hip: Secondary | ICD-10-CM | POA: Diagnosis not present

## 2021-01-13 DIAGNOSIS — M533 Sacrococcygeal disorders, not elsewhere classified: Secondary | ICD-10-CM | POA: Diagnosis not present

## 2021-01-13 DIAGNOSIS — M25551 Pain in right hip: Secondary | ICD-10-CM | POA: Diagnosis not present

## 2021-01-13 DIAGNOSIS — M25511 Pain in right shoulder: Secondary | ICD-10-CM | POA: Diagnosis not present

## 2021-01-13 DIAGNOSIS — G894 Chronic pain syndrome: Secondary | ICD-10-CM | POA: Diagnosis not present

## 2021-01-13 DIAGNOSIS — M170 Bilateral primary osteoarthritis of knee: Secondary | ICD-10-CM | POA: Diagnosis not present

## 2021-02-03 DIAGNOSIS — M47817 Spondylosis without myelopathy or radiculopathy, lumbosacral region: Secondary | ICD-10-CM | POA: Diagnosis not present

## 2021-03-10 DIAGNOSIS — M25552 Pain in left hip: Secondary | ICD-10-CM | POA: Diagnosis not present

## 2021-03-10 DIAGNOSIS — M25511 Pain in right shoulder: Secondary | ICD-10-CM | POA: Diagnosis not present

## 2021-03-10 DIAGNOSIS — M25551 Pain in right hip: Secondary | ICD-10-CM | POA: Diagnosis not present

## 2021-03-10 DIAGNOSIS — M25512 Pain in left shoulder: Secondary | ICD-10-CM | POA: Diagnosis not present

## 2021-03-10 DIAGNOSIS — M170 Bilateral primary osteoarthritis of knee: Secondary | ICD-10-CM | POA: Diagnosis not present

## 2021-03-10 DIAGNOSIS — G8929 Other chronic pain: Secondary | ICD-10-CM | POA: Diagnosis not present

## 2021-03-10 DIAGNOSIS — M546 Pain in thoracic spine: Secondary | ICD-10-CM | POA: Diagnosis not present

## 2021-03-10 DIAGNOSIS — M47816 Spondylosis without myelopathy or radiculopathy, lumbar region: Secondary | ICD-10-CM | POA: Diagnosis not present

## 2021-03-10 DIAGNOSIS — Z5181 Encounter for therapeutic drug level monitoring: Secondary | ICD-10-CM | POA: Diagnosis not present

## 2021-03-10 DIAGNOSIS — Z79899 Other long term (current) drug therapy: Secondary | ICD-10-CM | POA: Diagnosis not present

## 2021-03-10 DIAGNOSIS — M533 Sacrococcygeal disorders, not elsewhere classified: Secondary | ICD-10-CM | POA: Diagnosis not present

## 2021-03-10 DIAGNOSIS — M545 Low back pain, unspecified: Secondary | ICD-10-CM | POA: Diagnosis not present

## 2022-05-13 ENCOUNTER — Ambulatory Visit: Payer: Medicare Other | Admitting: Family Medicine

## 2022-06-30 ENCOUNTER — Telehealth: Payer: Self-pay | Admitting: *Deleted

## 2022-06-30 NOTE — Telephone Encounter (Signed)
Pt has new pt appt scheduled with Monia Pouch 07/27/22 but is calling in this morning c/o chest pain on and off for the last 8 months or so and says she has just been taking a baby ASA which seems to help. She is also c/o frequent nose bleeds and pain in her hands. Advised pt unfortunately we don't have any new pt appt's available till her appt but that she should be evaluated for the chest pain and to go to ED or UC and pt voiced understanding.

## 2022-07-19 ENCOUNTER — Ambulatory Visit (INDEPENDENT_AMBULATORY_CARE_PROVIDER_SITE_OTHER): Payer: Medicare Other | Admitting: Family Medicine

## 2022-07-19 ENCOUNTER — Encounter: Payer: Self-pay | Admitting: Family Medicine

## 2022-07-19 VITALS — BP 140/73 | HR 71 | Temp 95.1°F | Ht 69.0 in | Wt 157.4 lb

## 2022-07-19 DIAGNOSIS — IMO0002 Reserved for concepts with insufficient information to code with codable children: Secondary | ICD-10-CM

## 2022-07-19 DIAGNOSIS — N2889 Other specified disorders of kidney and ureter: Secondary | ICD-10-CM

## 2022-07-19 DIAGNOSIS — G894 Chronic pain syndrome: Secondary | ICD-10-CM

## 2022-07-19 DIAGNOSIS — S61209A Unspecified open wound of unspecified finger without damage to nail, initial encounter: Secondary | ICD-10-CM

## 2022-07-19 DIAGNOSIS — I7300 Raynaud's syndrome without gangrene: Secondary | ICD-10-CM

## 2022-07-19 DIAGNOSIS — M329 Systemic lupus erythematosus, unspecified: Secondary | ICD-10-CM

## 2022-07-19 DIAGNOSIS — L4050 Arthropathic psoriasis, unspecified: Secondary | ICD-10-CM

## 2022-07-19 DIAGNOSIS — E559 Vitamin D deficiency, unspecified: Secondary | ICD-10-CM

## 2022-07-19 DIAGNOSIS — S61202A Unspecified open wound of right middle finger without damage to nail, initial encounter: Secondary | ICD-10-CM

## 2022-07-19 MED ORDER — AMLODIPINE BESYLATE 2.5 MG PO TABS: 2.5000 mg | ORAL_TABLET | Freq: Every day | ORAL | 1 refills | Status: AC

## 2022-07-19 MED ORDER — DOXYCYCLINE HYCLATE 100 MG PO TABS: 100.0000 mg | ORAL_TABLET | Freq: Two times a day (BID) | ORAL | 0 refills | Status: AC
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
## 2022-07-19 NOTE — Addendum Note (Signed)
Addended by: Baruch Gouty on: 07/19/2022 04:56 PM   Modules accepted: Level of Service

## 2022-07-20 ENCOUNTER — Other Ambulatory Visit: Payer: Self-pay

## 2022-07-20 DIAGNOSIS — R748 Abnormal levels of other serum enzymes: Secondary | ICD-10-CM

## 2022-07-20 LAB — CMP14+EGFR
ALT: 90 IU/L — ABNORMAL HIGH (ref 0–32)
AST: 53 IU/L — ABNORMAL HIGH (ref 0–40)
Albumin/Globulin Ratio: 2.2 (ref 1.2–2.2)
Albumin: 4.2 g/dL (ref 3.8–4.9)
Alkaline Phosphatase: 129 IU/L — ABNORMAL HIGH (ref 44–121)
BUN/Creatinine Ratio: 32 — ABNORMAL HIGH (ref 9–23)
BUN: 22 mg/dL (ref 6–24)
Bilirubin Total: 0.3 mg/dL (ref 0.0–1.2)
CO2: 22 mmol/L (ref 20–29)
Calcium: 9.1 mg/dL (ref 8.7–10.2)
Chloride: 107 mmol/L — ABNORMAL HIGH (ref 96–106)
Creatinine, Ser: 0.69 mg/dL (ref 0.57–1.00)
Globulin, Total: 1.9 g/dL (ref 1.5–4.5)
Glucose: 87 mg/dL (ref 70–99)
Potassium: 4.5 mmol/L (ref 3.5–5.2)
Sodium: 143 mmol/L (ref 134–144)
Total Protein: 6.1 g/dL (ref 6.0–8.5)
eGFR: 100 mL/min/{1.73_m2} (ref >59)

## 2022-07-20 LAB — LIPID PANEL
Chol/HDL Ratio: 2.3 ratio (ref 0.0–4.4)
Cholesterol, Total: 167 mg/dL (ref 100–199)
HDL: 72 mg/dL (ref >39)
LDL Chol Calc (NIH): 81 mg/dL (ref 0–99)
Triglycerides: 71 mg/dL (ref 0–149)
VLDL Cholesterol Cal: 14 mg/dL (ref 5–40)

## 2022-07-20 LAB — CBC WITH DIFFERENTIAL/PLATELET
Basophils Absolute: 0 10*3/uL (ref 0.0–0.2)
Basos: 0 %
EOS (ABSOLUTE): 0 10*3/uL (ref 0.0–0.4)
Eos: 0 %
Hematocrit: 45.2 % (ref 34.0–46.6)
Hemoglobin: 15.3 g/dL (ref 11.1–15.9)
Immature Grans (Abs): 0 10*3/uL (ref 0.0–0.1)
Immature Granulocytes: 0 %
Lymphocytes Absolute: 1.3 10*3/uL (ref 0.7–3.1)
Lymphs: 13 %
MCH: 29.5 pg (ref 26.6–33.0)
MCHC: 33.8 g/dL (ref 31.5–35.7)
MCV: 87 fL (ref 79–97)
Monocytes Absolute: 0.6 10*3/uL (ref 0.1–0.9)
Monocytes: 6 %
Neutrophils Absolute: 7.9 10*3/uL — ABNORMAL HIGH (ref 1.4–7.0)
Neutrophils: 81 %
Platelets: 263 10*3/uL (ref 150–450)
RBC: 5.19 x10E6/uL (ref 3.77–5.28)
RDW: 13.4 % (ref 11.7–15.4)
WBC: 9.8 10*3/uL (ref 3.4–10.8)

## 2022-07-20 LAB — THYROID PANEL WITH TSH
Free Thyroxine Index: 1.9 (ref 1.2–4.9)
T3 Uptake Ratio: 24 % (ref 24–39)
T4, Total: 7.8 ug/dL (ref 4.5–12.0)
TSH: 1.31 u[IU]/mL (ref 0.450–4.500)

## 2022-07-20 LAB — VITAMIN D 25 HYDROXY (VIT D DEFICIENCY, FRACTURES): Vit D, 25-Hydroxy: 25.5 ng/mL — ABNORMAL LOW (ref 30.0–100.0)

## 2022-07-20 MED ORDER — VITAMIN D3 50 MCG (2000 UT) PO CAPS: 2000.0000 [IU] | ORAL_CAPSULE | Freq: Every day | ORAL | 3 refills | Status: AC

## 2022-07-20 NOTE — Addendum Note (Signed)
Addended by: Baruch Gouty on: 07/20/2022 11:40 AM   Modules accepted: Orders

## 2022-07-27 ENCOUNTER — Ambulatory Visit: Payer: Medicare Other | Admitting: Family Medicine

## 2022-08-02 ENCOUNTER — Ambulatory Visit (INDEPENDENT_AMBULATORY_CARE_PROVIDER_SITE_OTHER): Payer: Medicare Other

## 2022-08-02 VITALS — Ht 69.0 in | Wt 155.0 lb

## 2022-08-02 DIAGNOSIS — Z0000 Encounter for general adult medical examination without abnormal findings: Secondary | ICD-10-CM | POA: Diagnosis not present

## 2022-08-02 NOTE — Patient Instructions (Signed)
Bridget Young , Thank you for taking time to come for your Medicare Wellness Visit. I appreciate your ongoing commitment to your health goals. Please review the following plan we discussed and let me know if I can assist you in the future.   These are the goals we discussed:  Goals      DIET - INCREASE WATER INTAKE        This is a list of the screening recommended for you and due dates:  Health Maintenance  Topic Date Due   HIV Screening  Never done   Hepatitis C Screening: USPSTF Recommendation to screen - Ages 58-79 yo.  Never done   Pap Smear  Never done   Colon Cancer Screening  Never done   Screening for Lung Cancer  Never done   Mammogram  Never done   COVID-19 Vaccine (1) 08/04/2022*   Flu Shot  08/07/2022*   Zoster (Shingles) Vaccine (1 of 2) 10/19/2022*   Medicare Annual Wellness Visit  08/02/2023   HPV Vaccine  Aged Out   DTaP/Tdap/Td vaccine  Discontinued  *Topic was postponed. The date shown is not the original due date.    Advanced directives: Advance directive discussed with you today. I have provided a copy for you to complete at home and have notarized. Once this is complete please bring a copy in to our office so we can scan it into your chart.   Conditions/risks identified: Aim for 30 minutes of exercise or brisk walking, 6-8 glasses of water, and 5 servings of fruits and vegetables each day.   Next appointment: Follow up in one year for your annual wellness visit    Preventive Care 65 Years and Older, Female Preventive care refers to lifestyle choices and visits with your health care provider that can promote health and wellness. What does preventive care include? A yearly physical exam. This is also called an annual well check. Dental exams once or twice a year. Routine eye exams. Ask your health care provider how often you should have your eyes checked. Personal lifestyle choices, including: Daily care of your teeth and gums. Regular physical  activity. Eating a healthy diet. Avoiding tobacco and drug use. Limiting alcohol use. Practicing safe sex. Taking low-dose aspirin every day. Taking vitamin and mineral supplements as recommended by your health care provider. What happens during an annual well check? The services and screenings done by your health care provider during your annual well check will depend on your age, overall health, lifestyle risk factors, and family history of disease. Counseling  Your health care provider may ask you questions about your: Alcohol use. Tobacco use. Drug use. Emotional well-being. Home and relationship well-being. Sexual activity. Eating habits. History of falls. Memory and ability to understand (cognition). Work and work Statistician. Reproductive health. Screening  You may have the following tests or measurements: Height, weight, and BMI. Blood pressure. Lipid and cholesterol levels. These may be checked every 5 years, or more frequently if you are over 57 years old. Skin check. Lung cancer screening. You may have this screening every year starting at age 68 if you have a 30-pack-year history of smoking and currently smoke or have quit within the past 15 years. Fecal occult blood test (FOBT) of the stool. You may have this test every year starting at age 22. Flexible sigmoidoscopy or colonoscopy. You may have a sigmoidoscopy every 5 years or a colonoscopy every 10 years starting at age 5. Hepatitis C blood test. Hepatitis B blood test.  Sexually transmitted disease (STD) testing. Diabetes screening. This is done by checking your blood sugar (glucose) after you have not eaten for a while (fasting). You may have this done every 1-3 years. Bone density scan. This is done to screen for osteoporosis. You may have this done starting at age 61. Mammogram. This may be done every 1-2 years. Talk to your health care provider about how often you should have regular mammograms. Talk with your  health care provider about your test results, treatment options, and if necessary, the need for more tests. Vaccines  Your health care provider may recommend certain vaccines, such as: Influenza vaccine. This is recommended every year. Tetanus, diphtheria, and acellular pertussis (Tdap, Td) vaccine. You may need a Td booster every 10 years. Zoster vaccine. You may need this after age 19. Pneumococcal 13-valent conjugate (PCV13) vaccine. One dose is recommended after age 60. Pneumococcal polysaccharide (PPSV23) vaccine. One dose is recommended after age 66. Talk to your health care provider about which screenings and vaccines you need and how often you need them. This information is not intended to replace advice given to you by your health care provider. Make sure you discuss any questions you have with your health care provider. Document Released: 05/22/2015 Document Revised: 01/13/2016 Document Reviewed: 02/24/2015 Elsevier Interactive Patient Education  2017 [place] Prevention in the Home Falls can cause injuries. They can happen to people of all ages. There are many things you can do to make your home safe and to help prevent falls. What can I do on the outside of my home? Regularly fix the edges of walkways and driveways and fix any cracks. Remove anything that might make you trip as you walk through a door, such as a raised step or threshold. Trim any bushes or trees on the path to your home. Use bright outdoor lighting. Clear any walking paths of anything that might make someone trip, such as rocks or tools. Regularly check to see if handrails are loose or broken. Make sure that both sides of any steps have handrails. Any raised decks and porches should have guardrails on the edges. Have any leaves, snow, or ice cleared regularly. Use sand or salt on walking paths during winter. Clean up any spills in your garage right away. This includes oil or grease spills. What can I  do in the bathroom? Use night lights. Install grab bars by the toilet and in the tub and shower. Do not use towel bars as grab bars. Use non-skid mats or decals in the tub or shower. If you need to sit down in the shower, use a plastic, non-slip stool. Keep the floor dry. Clean up any water that spills on the floor as soon as it happens. Remove soap buildup in the tub or shower regularly. Attach bath mats securely with double-sided non-slip rug tape. Do not have throw rugs and other things on the floor that can make you trip. What can I do in the bedroom? Use night lights. Make sure that you have a light by your bed that is easy to reach. Do not use any sheets or blankets that are too big for your bed. They should not hang down onto the floor. Have a firm chair that has side arms. You can use this for support while you get dressed. Do not have throw rugs and other things on the floor that can make you trip. What can I do in the kitchen? Clean up any spills right away.  Avoid walking on wet floors. Keep items that you use a lot in easy-to-reach places. If you need to reach something above you, use a strong step stool that has a grab bar. Keep electrical cords out of the way. Do not use floor polish or wax that makes floors slippery. If you must use wax, use non-skid floor wax. Do not have throw rugs and other things on the floor that can make you trip. What can I do with my stairs? Do not leave any items on the stairs. Make sure that there are handrails on both sides of the stairs and use them. Fix handrails that are broken or loose. Make sure that handrails are as long as the stairways. Check any carpeting to make sure that it is firmly attached to the stairs. Fix any carpet that is loose or worn. Avoid having throw rugs at the top or bottom of the stairs. If you do have throw rugs, attach them to the floor with carpet tape. Make sure that you have a light switch at the top of the stairs  and the bottom of the stairs. If you do not have them, ask someone to add them for you. What else can I do to help prevent falls? Wear shoes that: Do not have high heels. Have rubber bottoms. Are comfortable and fit you well. Are closed at the toe. Do not wear sandals. If you use a stepladder: Make sure that it is fully opened. Do not climb a closed stepladder. Make sure that both sides of the stepladder are locked into place. Ask someone to hold it for you, if possible. Clearly mark and make sure that you can see: Any grab bars or handrails. First and last steps. Where the edge of each step is. Use tools that help you move around (mobility aids) if they are needed. These include: Canes. Walkers. Scooters. Crutches. Turn on the lights when you go into a dark area. Replace any light bulbs as soon as they burn out. Set up your furniture so you have a clear path. Avoid moving your furniture around. If any of your floors are uneven, fix them. If there are any pets around you, be aware of where they are. Review your medicines with your doctor. Some medicines can make you feel dizzy. This can increase your chance of falling. Ask your doctor what other things that you can do to help prevent falls. This information is not intended to replace advice given to you by your health care provider. Make sure you discuss any questions you have with your health care provider. Document Released: 02/19/2009 Document Revised: 10/01/2015 Document Reviewed: 05/30/2014 Elsevier Interactive Patient Education  2017 [employer].

## 2022-08-02 NOTE — Progress Notes (Signed)
Subjective:     Subjective:   Bridget Young is a 62 y.o. female who presents for an Initial Medicare Annual Wellness Visit. I connected with  Bridget Young on 08/02/22 by a audio enabled telemedicine application and verified that I am speaking with the correct person using two identifiers.  Patient Location: Home  Provider Location: Home Office  I discussed the limitations of evaluation and management by telemedicine. The patient expressed understanding and agreed to proceed.  Review of Systems     Cardiac Risk Factors include: advanced age (>67men, >1 women);hypertension     Objective:    Today's Vitals   08/02/22 0853  Weight: 155 lb (70.3 kg)  Height: 5\' 9"  (1.753 m)   Body mass index is 22.89 kg/m.     08/02/2022    8:56 AM  Advanced Directives  Does Patient Have a Medical Advance Directive? Yes  Type of Paramedic of [place];Living will  Copy of [place] in Chart? No - copy requested    Current Medications (verified) Outpatient Encounter Medications as of 08/02/2022  Medication Sig   amLODipine (NORVASC) 2.5 MG tablet Take 1 tablet (2.5 mg total) by mouth daily.   Cholecalciferol (VITAMIN D3) 50 MCG (2000 UT) CAPS Take 1 capsule (2,000 Units total) by mouth daily.   loratadine (CLARITIN) 5 MG chewable tablet Chew 5 mg by mouth daily.   No facility-administered encounter medications on file as of 08/02/2022.    Allergies (verified) Clonidine derivatives   History: Past Medical History:  Diagnosis Date   Cancer [facility]) age 45   cervical    Lupus ([place])    Polycythemia    Raynaud's disease    Past Surgical History:  Procedure Laterality Date   ABDOMINAL HYSTERECTOMY  age 31   BACK SURGERY     HIP SURGERY Bilateral    x 4    Family History  Problem Relation Age of Onset   Diabetes Mother    Cancer Father        lung cancer   Cancer Sister        breast   Heart disease Brother    Social  History   Socioeconomic History   Marital status: Divorced    Spouse name: Not on file   Number of children: 3   Years of education: Not on file   Highest education level: Not on file  Occupational History   Not on file  Tobacco Use   Smoking status: Every Day    Packs/day: 0.50    Years: 45.00    Additional pack years: 0.00    Total pack years: 22.50    Types: Cigarettes   Smokeless tobacco: Never  Vaping Use   Vaping Use: Never used  Substance and Sexual Activity   Alcohol use: No   Drug use: No   Sexual activity: Not Currently  Other Topics Concern   Not on file  Social History Narrative   Not on file   Social Determinants of Health   Financial Resource Strain: Low Risk  (08/02/2022)   Overall Financial Resource Strain (CARDIA)    Difficulty of Paying Living Expenses: Not hard at all  Food Insecurity: No Food Insecurity (08/02/2022)   Hunger Vital Sign    Worried About Running Out of Food in the Last Year: Never true    Ran Out of Food in the Last Year: Never true  Transportation Needs: No Transportation Needs (08/02/2022)   PRAPARE - Transportation  Lack of Transportation (Medical): No    Lack of Transportation (Non-Medical): No  Physical Activity: Insufficiently Active (08/02/2022)   Exercise Vital Sign    Days of Exercise per Week: 3 days    Minutes of Exercise per Session: 30 min  Stress: No Stress Concern Present (08/02/2022)   [place]    Feeling of Stress : Not at all  Social Connections: Socially Isolated (08/02/2022)   Social Connection and Isolation Panel [NHANES]    Frequency of Communication with Friends and Family: More than three times a week    Frequency of Social Gatherings with Friends and Family: More than three times a week    Attends Religious Services: Never    Marine scientist or Organizations: No    Attends Music therapist: Never    Marital Status:  Divorced    Tobacco Counseling Ready to quit: Yes Counseling given: Not Answered   Clinical Intake:  Pre-visit preparation completed: Yes  Pain : No/denies pain     Nutritional Risks: None Diabetes: No  How often do you need to have someone help you when you read instructions, pamphlets, or other written materials from your doctor or pharmacy?: 1 - Never  Diabetic?no   Interpreter Needed?: No  Information entered by :: Jadene Pierini, LPN   Activities of Daily Living    08/02/2022    8:56 AM  In your present state of health, do you have any difficulty performing the following activities:  Hearing? 0  Vision? 0  Difficulty concentrating or making decisions? 0  Walking or climbing stairs? 0  Dressing or bathing? 0  Doing errands, shopping? 0  Preparing Food and eating ? N  Using the Toilet? N  In the past six months, have you accidently leaked urine? N  Do you have problems with loss of bowel control? N  Managing your Medications? N  Managing your Finances? N  Housekeeping or managing your Housekeeping? N    Patient Care Team: Baruch Gouty, FNP as PCP - General (Family Medicine)  Indicate any recent Medical Services you may have received from other than [facility] providers in the past year (date may be approximate).     Assessment:   This is a routine wellness examination for [place].  Hearing/Vision screen Vision Screening - Comments:: Wears rx glasses - up to date with routine eye exams with  Dr.Johnson   Dietary issues and exercise activities discussed: Current Exercise Habits: Home exercise routine, Type of exercise: walking, Time (Minutes): 30, Frequency (Times/Week): 3, Weekly Exercise (Minutes/Week): 90, Intensity: Mild, Exercise limited by: None identified   Goals Addressed             This Visit's Progress    DIET - INCREASE WATER INTAKE         Depression Screen    08/02/2022    8:55 AM 04/14/2017    4:25 PM 12/12/2016   10:06 AM  09/23/2016    1:51 PM 10/16/2015    2:17 PM  PHQ 2/9 Scores  PHQ - 2 Score 0 0 0 0 0    Fall Risk    08/02/2022    8:54 AM 04/14/2017    4:25 PM 12/12/2016   10:06 AM 09/23/2016    1:51 PM 10/16/2015    2:17 PM  Fall Risk   Falls in the past year? 0 No No No No  Number falls in past yr: 0  Injury with Fall? 0      Risk for fall due to : No Fall Risks      Follow up Falls prevention discussed        [place]:  Any stairs in or around the home? Yes  If so, are there any without handrails? No  Home free of loose throw rugs in walkways, pet beds, electrical cords, etc? Yes  Adequate lighting in your home to reduce risk of falls? Yes   ASSISTIVE DEVICES UTILIZED TO PREVENT FALLS:  Life alert? No  Use of a cane, walker or w/c? Yes  Grab bars in the bathroom? Yes  Shower chair or bench in shower? Yes  Elevated toilet seat or a handicapped toilet? Yes         08/02/2022    8:57 AM  6CIT Screen  What Year? 0 points  What month? 0 points  What time? 0 points  Count back from 20 0 points  Months in reverse 0 points  Repeat phrase 0 points  Total Score 0 points    Immunizations  There is no immunization history on file for this patient.  TDAP status: Due, Education has been provided regarding the importance of this vaccine. Advised may receive this vaccine at local pharmacy or Health Dept. Aware to provide a copy of the vaccination record if obtained from local pharmacy or Health Dept. Verbalized acceptance and understanding.  Flu Vaccine status: Declined, Education has been provided regarding the importance of this vaccine but patient still declined. Advised may receive this vaccine at local pharmacy or Health Dept. Aware to provide a copy of the vaccination record if obtained from local pharmacy or Health Dept. Verbalized acceptance and understanding.  Pneumococcal vaccine status: Declined,  Education has been provided regarding the  importance of this vaccine but patient still declined. Advised may receive this vaccine at local pharmacy or Health Dept. Aware to provide a copy of the vaccination record if obtained from local pharmacy or Health Dept. Verbalized acceptance and understanding.   Covid-19 vaccine status: Declined, Education has been provided regarding the importance of this vaccine but patient still declined. Advised may receive this vaccine at local pharmacy or Health Dept.or vaccine clinic. Aware to provide a copy of the vaccination record if obtained from local pharmacy or Health Dept. Verbalized acceptance and understanding.  Qualifies for Shingles Vaccine? Yes   Zostavax completed No   Shingrix Completed?: No.    Education has been provided regarding the importance of this vaccine. Patient has been advised to Young insurance company to determine out of pocket expense if they have not yet received this vaccine. Advised may also receive vaccine at local pharmacy or Health Dept. Verbalized acceptance and understanding.  Screening Tests Health Maintenance  Topic Date Due   HIV Screening  Never done   Hepatitis C Screening  Never done   PAP SMEAR-Modifier  Never done   COLONOSCOPY (Pts 45-35yrs Insurance coverage will need to be confirmed)  Never done   Lung Cancer Screening  Never done   MAMMOGRAM  Never done   COVID-19 Vaccine (1) 08/04/2022 (Originally 02/22/1966)   INFLUENZA VACCINE  08/07/2022 (Originally 12/07/2021)   Zoster Vaccines- Shingrix (1 of 2) 10/19/2022 (Originally [birth year]/21/1982)   Medicare Annual Wellness (AWV)  08/02/2023   HPV VACCINES  Aged Out   DTaP/Tdap/Td  Discontinued    Health Maintenance  Health Maintenance Due  Topic Date Due   HIV Screening  Never done  Hepatitis C Screening  Never done   PAP SMEAR-Modifier  Never done   COLONOSCOPY (Pts 45-52yrs Insurance coverage will need to be confirmed)  Never done   Lung Cancer Screening  Never done   MAMMOGRAM  Never done     Colorectal cancer screening: Referral to GI placed declined . Pt aware the office will Young re: appt.  Mammogram status: Ordered declined . Pt provided with contact info and advised to Young to schedule appt.   Bone Density status: Ordered not of age . Pt provided with contact info and advised to Young to schedule appt.  Lung Cancer Screening: (Low Dose CT Chest recommended if Age 72-80 years, 30 pack-year currently smoking OR have quit w/in 15years.) does qualify.   Lung Cancer Screening Referral: will discuss with PCP   Additional Screening:  Hepatitis C Screening: does not qualify;   Vision Screening: Recommended annual ophthalmology exams for early detection of glaucoma and other disorders of the eye. Is the patient up to date with their annual eye exam?  Yes  Who is the provider or what is the name of the office in which the patient attends annual eye exams? Dr.Johnson  If pt is not established with a provider, would they like to be referred to a provider to establish care? No .   Dental Screening: Recommended annual dental exams for proper oral hygiene  Community Resource Referral / Chronic Care Management: CRR required this visit?  No   CCM required this visit?  No      Plan:     I have personally reviewed and noted the following in the patient's chart:   Medical and social history Use of alcohol, tobacco or illicit drugs  Current medications and supplements including opioid prescriptions. Patient is not currently taking opioid prescriptions. Functional ability and status Nutritional status Physical activity Advanced directives List of other physicians Hospitalizations, surgeries, and ER visits in previous 12 months Vitals Screenings to include cognitive, depression, and falls Referrals and appointments  In addition, I have reviewed and discussed with patient certain preventive protocols, quality metrics, and best practice recommendations. A written  personalized care plan for preventive services as well as general preventive health recommendations were provided to patient.     Daphane Shepherd, LPN   [phone]   Nurse Notes: NO Vaccines on file , patient declines colonoscopy and mammogram  will discuss Lung cancer screening with PCP

## 2022-08-03 ENCOUNTER — Ambulatory Visit (HOSPITAL_BASED_OUTPATIENT_CLINIC_OR_DEPARTMENT_OTHER)
Admission: RE | Admit: 2022-08-03 | Discharge: 2022-08-03 | Disposition: A | Discharge status: Home / Self Care | Payer: Medicare Other | Source: Ambulatory Visit | Attending: Family Medicine | Admitting: Family Medicine

## 2022-08-03 DIAGNOSIS — N2889 Other specified disorders of kidney and ureter: Secondary | ICD-10-CM | POA: Insufficient documentation

## 2022-08-03 MED ORDER — IOHEXOL 300 MG/ML  SOLN
100.0000 mL | Freq: Once | INTRAMUSCULAR | Status: AC | PRN
  Administered 2022-08-03: 80 mL via INTRAVENOUS

## 2022-08-08 ENCOUNTER — Encounter: Payer: Self-pay | Admitting: Family Medicine

## 2022-08-09 ENCOUNTER — Telehealth (INDEPENDENT_AMBULATORY_CARE_PROVIDER_SITE_OTHER): Payer: Medicare Other | Admitting: Family Medicine

## 2022-08-09 ENCOUNTER — Encounter: Payer: Self-pay | Admitting: Family Medicine

## 2022-08-09 ENCOUNTER — Telehealth: Payer: Self-pay | Admitting: Family Medicine

## 2022-08-09 NOTE — Telephone Encounter (Signed)
Pt was not able to do the video visit with Rakes. Pt aware that she would need to schedule in office. Pt then asked for her ECG results from this morning done at [facility]. She says that she was told her results would be ready today at lunch time. Please call back with results.

## 2022-08-09 NOTE — Telephone Encounter (Signed)
Refer to my chart message - no results have been received

## 2022-08-09 NOTE — Progress Notes (Signed)
Patient was not able to connect to video. Will have to schedule an in office appointment.

## 2022-08-10 ENCOUNTER — Telehealth: Payer: Self-pay | Admitting: Family Medicine

## 2022-08-10 ENCOUNTER — Other Ambulatory Visit: Payer: Medicare Other

## 2022-08-10 DIAGNOSIS — R748 Abnormal levels of other serum enzymes: Secondary | ICD-10-CM

## 2022-08-10 LAB — CMP14+EGFR
ALT: 119 IU/L — ABNORMAL HIGH (ref 0–32)
AST: 68 IU/L — ABNORMAL HIGH (ref 0–40)
Albumin/Globulin Ratio: 2.4 — ABNORMAL HIGH (ref 1.2–2.2)
Albumin: 4.3 g/dL (ref 3.8–4.9)
Alkaline Phosphatase: 123 IU/L — ABNORMAL HIGH (ref 44–121)
BUN/Creatinine Ratio: 18 (ref 9–23)
BUN: 14 mg/dL (ref 6–24)
Bilirubin Total: 0.3 mg/dL (ref 0.0–1.2)
CO2: 22 mmol/L (ref 20–29)
Calcium: 9.2 mg/dL (ref 8.7–10.2)
Chloride: 107 mmol/L — ABNORMAL HIGH (ref 96–106)
Creatinine, Ser: 0.77 mg/dL (ref 0.57–1.00)
Globulin, Total: 1.8 g/dL (ref 1.5–4.5)
Glucose: 93 mg/dL (ref 70–99)
Potassium: 4 mmol/L (ref 3.5–5.2)
Sodium: 145 mmol/L — ABNORMAL HIGH (ref 134–144)
Total Protein: 6.1 g/dL (ref 6.0–8.5)
eGFR: 89 mL/min/{1.73_m2} (ref >59)

## 2022-08-10 NOTE — Telephone Encounter (Signed)
When called patient backed to triage she states she wanted to know how to get her medication. It is her nucenta 175mg  she use to get from pain management patient states she spoke with you about this and you told her to call them and they still could not get it for her. Do not see on patients med list what can she do?

## 2022-08-10 NOTE — Telephone Encounter (Signed)
Patient aware and verbalizes understanding.  Would like update on referral to pain management.

## 2022-08-10 NOTE — Telephone Encounter (Signed)
Patient calling to check on status of Nucynta, said it was discussed with PCP today. Please send to [facility] in [place]

## 2022-08-10 NOTE — Telephone Encounter (Signed)
Refer to previous phone note 

## 2022-08-11 NOTE — Telephone Encounter (Signed)
Patient came by the office to see if Nucynta had been called into [facility] in [place]. Please call patient and advise if it is going to be called in.

## 2022-08-11 NOTE — Telephone Encounter (Signed)
Lmtcb "   08/10/22 10:21 AM "She needs to see pain management. They have stopped this medication. I have not and will not fill this." Per Rakes

## 2022-08-11 NOTE — Telephone Encounter (Signed)
Made patient aware of Monia Pouch note. Patient wants to know if there is anyone that PCP recommends because the last pain management office she saw about 3-4 wks ago, she does not want to go back to because they are basically holding her hostage saying that she owes then $5000.   Please advise.

## 2022-08-12 ENCOUNTER — Other Ambulatory Visit: Payer: Self-pay | Admitting: Family Medicine

## 2022-08-12 ENCOUNTER — Telehealth: Payer: Self-pay | Admitting: Family Medicine

## 2022-08-12 DIAGNOSIS — G894 Chronic pain syndrome: Secondary | ICD-10-CM

## 2022-08-12 NOTE — Telephone Encounter (Signed)
Left detailed message - advised pt to call back if she wanted [place] to place a referral

## 2022-08-12 NOTE — Telephone Encounter (Signed)
She needs new referral to pain clinic I called her [facility] to see why they will not send meds in anymore and had to to leave message waiting for a call back. This is the third time the patient has walked in asking for this med. I asked patient if she had been advised that you were not going to call this in and she said yes. She then kept going on about there was just a confusion about it. Then asked would she send in new referral to pain doctor and if you would call something in for her.

## 2022-08-15 ENCOUNTER — Telehealth: Payer: Self-pay | Admitting: Family Medicine

## 2022-08-15 NOTE — Telephone Encounter (Signed)
REFERRAL REQUEST Telephone Note  Have you been seen at our office for this problem? yes (Advise that they may need an appointment with their PCP before a referral can be done)  Reason for Referral: pain clinic Referral discussed with patient: yes  Best contact number of patient for referral team: [phone]    Has patient been seen by a specialist for this issue before: not sure  Patient provider preference for referral: not sure Patient location preference for referral: not sure   Patient notified that referrals can take up to a week or longer to process. If they haven't heard anything within a week they should call back and speak with the referral department.    Rakes' pt. Please call pt today about referral to pain clinic. ASAP  She said no one has contacted her.

## 2022-08-16 ENCOUNTER — Encounter: Payer: Self-pay | Admitting: Family Medicine

## 2022-08-16 ENCOUNTER — Telehealth: Payer: Self-pay | Admitting: Family Medicine

## 2022-08-16 NOTE — Telephone Encounter (Signed)
Dismissal letter sent to MyChart today.  Will mail as well.

## 2022-08-16 NOTE — Telephone Encounter (Signed)
Patient walked into office today and began demanding an appointment to be seen for her pain medication.  She continued to harass the staff. I spoke with the patient and explained that we would not be prescribing her pain medication per Kari Baars, and she is to continue with her pain management provider.  She states they will not give her the medicine.  Wanted to speak to Rakes, and I told her she would need to schedule an appointment to discuss this.  She refused and stated she would contact law enforcement on [place].  Patient has been dismissed for her behavior and harrassment.

## 2022-08-22 NOTE — Progress Notes (Deleted)
Referring Provider: Sonny Masters, FNP  Primary Care Physician:  No primary care provider on file. Primary Gastroenterologist:  Dr. Marland Kitchen  No chief complaint on file.   HPI:   Bridget Young is a 62 y.o. female presenting today at the request of Sonny Masters, FNP for abnormal liver enzymes.  Per chart review: LFTs normal in June 2017. February 2024: AST 50 (H), ALT 85 (H), alk phos 114, total bilirubin 0.4. March 2024: AST 53 (H), ALT 90 (H), alk phos 129 (H), total bilirubin 0.3. Platelets normal.  08/10/2022: AST 68 (H), ALT 119 (), alk phos 123 (), total bilirubin 0.3.  CT A/P with contrast 08/03/2022: No focal liver lesion.  Normal spleen.  Today:    Tylenol: NSAIDs: Over-the-counter supplements/herbal teas: New medications: Alcohol: Illicit drug use: Family history:   Past Medical History:  Diagnosis Date   Cancer age 27   cervical    Lupus    Polycythemia    Raynaud's disease     Past Surgical History:  Procedure Laterality Date   ABDOMINAL HYSTERECTOMY  age 21   BACK SURGERY     HIP SURGERY Bilateral    x 4     Current Outpatient Medications  Medication Sig Dispense Refill   amLODipine (NORVASC) 2.5 MG tablet Take 1 tablet (2.5 mg total) by mouth daily. 90 tablet 1   Cholecalciferol (VITAMIN D3) 50 MCG (2000 UT) CAPS Take 1 capsule (2,000 Units total) by mouth daily. 30 capsule 3   loratadine (CLARITIN) 5 MG chewable tablet Chew 5 mg by mouth daily.     No current facility-administered medications for this visit.    Allergies as of 08/25/2022 - Review Complete 08/09/2022  Allergen Reaction Noted   Clonidine derivatives Other (See Comments) 02/08/2016    Family History  Problem Relation Age of Onset   Diabetes Mother    Cancer Father        lung cancer   Cancer Sister        breast   Heart disease Brother     Social History   Socioeconomic History   Marital status: Divorced    Spouse name: Not on file   Number of children: 3    Years of education: Not on file   Highest education level: Not on file  Occupational History   Not on file  Tobacco Use   Smoking status: Every Day    Packs/day: 0.50    Years: 45.00    Additional pack years: 0.00    Total pack years: 22.50    Types: Cigarettes   Smokeless tobacco: Never  Vaping Use   Vaping Use: Never used  Substance and Sexual Activity   Alcohol use: No   Drug use: No   Sexual activity: Not Currently  Other Topics Concern   Not on file  Social History Narrative   Not on file   Social Determinants of Health   Financial Resource Strain: Low Risk  (08/02/2022)   Overall Financial Resource Strain (CARDIA)    Difficulty of Paying Living Expenses: Not hard at all  Food Insecurity: No Food Insecurity (08/02/2022)   Hunger Vital Sign    Worried About Running Out of Food in the Last Year: Never true    Ran Out of Food in the Last Year: Never true  Transportation Needs: No Transportation Needs (08/02/2022)   PRAPARE - Administrator, Civil Service (Medical): No    Lack of Transportation (Non-Medical): No  Physical  Activity: Insufficiently Active (08/02/2022)   Exercise Vital Sign    Days of Exercise per Week: 3 days    Minutes of Exercise per Session: 30 min  Stress: No Stress Concern Present (08/02/2022)   [employer] of Occupational Health - Occupational Stress Questionnaire    Feeling of Stress : Not at all  Social Connections: Socially Isolated (08/02/2022)   Social Connection and Isolation Panel [NHANES]    Frequency of Communication with Friends and Family: More than three times a week    Frequency of Social Gatherings with Friends and Family: More than three times a week    Attends Religious Services: Never    Database administrator or Organizations: No    Attends Banker Meetings: Never    Marital Status: Divorced  Catering manager Violence: Not At Risk (08/02/2022)   Humiliation, Afraid, Rape, and Kick questionnaire    Fear  of Current or Ex-Partner: No    Emotionally Abused: No    Physically Abused: No    Sexually Abused: No    Review of Systems: Gen: Denies any fever, chills, fatigue, weight loss, lack of appetite.  CV: Denies chest pain, heart palpitations, peripheral edema, syncope.  Resp: Denies shortness of breath at rest or with exertion. Denies wheezing or cough.  GI: Denies dysphagia or odynophagia. Denies jaundice, hematemesis, fecal incontinence. GU : Denies urinary burning, urinary frequency, urinary hesitancy MS: Denies joint pain, muscle weakness, cramps, or limitation of movement.  Derm: Denies rash, itching, dry skin Psych: Denies depression, anxiety, memory loss, and confusion Heme: Denies bruising, bleeding, and enlarged lymph nodes.  Physical Exam: There were no vitals taken for this visit. General:   Alert and oriented. Pleasant and cooperative. Well-nourished and well-developed.  Head:  Normocephalic and atraumatic. Eyes:  Without icterus, sclera clear and conjunctiva pink.  Ears:  Normal auditory acuity. Lungs:  Clear to auscultation bilaterally. No wheezes, rales, or rhonchi. No distress.  Heart:  S1, S2 present without murmurs appreciated.  Abdomen:  +BS, soft, non-tender and non-distended. No HSM noted. No guarding or rebound. No masses appreciated.  Rectal:  Deferred  Msk:  Symmetrical without gross deformities. Normal posture. Extremities:  Without edema. Neurologic:  Alert and  oriented x4;  grossly normal neurologically. Skin:  Intact without significant lesions or rashes. Psych:  Alert and cooperative. Normal mood and affect.    Assessment:     Plan:  ***   Ermalinda Memos, PA-C [facility] 08/25/2022

## 2022-08-25 ENCOUNTER — Ambulatory Visit: Payer: Medicare Other | Admitting: Gastroenterology

## 2022-08-25 ENCOUNTER — Encounter: Payer: Self-pay | Admitting: Gastroenterology

## 2022-09-11 ENCOUNTER — Other Ambulatory Visit: Payer: Self-pay | Admitting: Family Medicine

## 2022-09-11 DIAGNOSIS — E559 Vitamin D deficiency, unspecified: Secondary | ICD-10-CM

## 2022-09-26 NOTE — Telephone Encounter (Signed)
Old encounter will close

## 2022-11-25 ENCOUNTER — Encounter: Payer: Medicare Other | Admitting: Family Medicine

## 2023-05-19 ENCOUNTER — Other Ambulatory Visit (HOSPITAL_COMMUNITY): Payer: Self-pay

## 2023-05-19 MED ORDER — HYDROMORPHONE HCL 2 MG PO TABS
2.0000 mg | ORAL_TABLET | Freq: Three times a day (TID) | ORAL | 0 refills | Status: DC
  Filled 2023-05-19 – 2023-05-22 (×2): qty 90, 30d supply, fill #0

## 2023-05-20 ENCOUNTER — Other Ambulatory Visit (HOSPITAL_COMMUNITY): Payer: Self-pay

## 2023-05-22 ENCOUNTER — Other Ambulatory Visit (HOSPITAL_COMMUNITY): Payer: Self-pay

## 2023-05-23 ENCOUNTER — Other Ambulatory Visit (HOSPITAL_COMMUNITY): Payer: Self-pay

## 2023-05-24 ENCOUNTER — Other Ambulatory Visit (HOSPITAL_COMMUNITY): Payer: Self-pay

## 2023-06-15 ENCOUNTER — Other Ambulatory Visit (HOSPITAL_COMMUNITY): Payer: Self-pay

## 2023-06-15 MED ORDER — HYDROMORPHONE HCL 2 MG PO TABS
2.0000 mg | ORAL_TABLET | Freq: Three times a day (TID) | ORAL | 0 refills | Status: DC
  Filled 2023-06-15 – 2023-06-22 (×5): qty 90, 30d supply, fill #0

## 2023-06-21 ENCOUNTER — Other Ambulatory Visit (HOSPITAL_COMMUNITY): Payer: Self-pay

## 2023-06-22 ENCOUNTER — Other Ambulatory Visit (HOSPITAL_COMMUNITY): Payer: Self-pay

## 2023-06-22 ENCOUNTER — Other Ambulatory Visit: Payer: Self-pay

## 2023-06-23 ENCOUNTER — Other Ambulatory Visit: Payer: Self-pay

## 2023-07-25 ENCOUNTER — Other Ambulatory Visit (HOSPITAL_COMMUNITY): Payer: Self-pay

## 2023-07-25 MED ORDER — BACLOFEN 10 MG PO TABS
10.0000 mg | ORAL_TABLET | Freq: Three times a day (TID) | ORAL | 0 refills | Status: AC | PRN
  Filled 2023-07-25 – 2023-08-24 (×2): qty 90, 30d supply, fill #0

## 2023-07-25 MED ORDER — NALOXONE HCL 4 MG/0.1ML NA LIQD
4.0000 mg | NASAL | 0 refills | Status: AC
  Filled 2023-07-25: qty 2, 1d supply, fill #0

## 2023-07-25 MED ORDER — HYDROMORPHONE HCL 2 MG PO TABS
2.0000 mg | ORAL_TABLET | Freq: Three times a day (TID) | ORAL | 0 refills | Status: DC
  Filled 2023-07-25: qty 90, 30d supply, fill #0

## 2023-07-25 MED ORDER — GABAPENTIN 400 MG PO CAPS
400.0000 mg | ORAL_CAPSULE | Freq: Three times a day (TID) | ORAL | 0 refills | Status: AC
  Filled 2023-07-25: qty 90, 30d supply, fill #0

## 2023-08-11 ENCOUNTER — Other Ambulatory Visit (HOSPITAL_COMMUNITY): Payer: Self-pay

## 2023-08-11 ENCOUNTER — Other Ambulatory Visit: Payer: Self-pay

## 2023-08-11 MED ORDER — HYDROMORPHONE HCL 2 MG PO TABS
2.0000 mg | ORAL_TABLET | Freq: Three times a day (TID) | ORAL | 0 refills | Status: DC
  Filled 2023-08-19 – 2023-08-24 (×2): qty 90, 30d supply, fill #0

## 2023-08-11 MED ORDER — ALENDRONATE SODIUM 70 MG PO TABS
70.0000 mg | ORAL_TABLET | ORAL | 2 refills | Status: AC
  Filled 2023-08-11: qty 12, 84d supply, fill #0
  Filled 2023-08-24: qty 16, 112d supply, fill #0

## 2023-08-11 MED ORDER — NALOXONE HCL 4 MG/0.1ML NA LIQD
NASAL | 0 refills | Status: AC
  Filled 2023-08-11: qty 2, 1d supply, fill #0

## 2023-08-11 MED ORDER — PREGABALIN 50 MG PO CAPS
50.0000 mg | ORAL_CAPSULE | Freq: Three times a day (TID) | ORAL | 0 refills | Status: AC
  Filled 2023-08-11 – 2023-08-24 (×2): qty 90, 30d supply, fill #0

## 2023-08-11 MED ORDER — BACLOFEN 20 MG PO TABS
20.0000 mg | ORAL_TABLET | Freq: Three times a day (TID) | ORAL | 0 refills | Status: AC
  Filled 2023-08-11: qty 90, 30d supply, fill #0

## 2023-08-19 ENCOUNTER — Other Ambulatory Visit (HOSPITAL_COMMUNITY): Payer: Self-pay

## 2023-08-21 ENCOUNTER — Other Ambulatory Visit (HOSPITAL_COMMUNITY): Payer: Self-pay

## 2023-08-24 ENCOUNTER — Other Ambulatory Visit (HOSPITAL_COMMUNITY): Payer: Self-pay

## 2023-09-20 ENCOUNTER — Other Ambulatory Visit: Payer: Self-pay

## 2023-09-20 ENCOUNTER — Other Ambulatory Visit (HOSPITAL_COMMUNITY): Payer: Self-pay

## 2023-09-20 MED ORDER — NALOXONE HCL 4 MG/0.1ML NA LIQD
NASAL | 0 refills | Status: AC
  Filled 2023-09-20: qty 2, 1d supply, fill #0

## 2023-09-20 MED ORDER — PREGABALIN 75 MG PO CAPS
75.0000 mg | ORAL_CAPSULE | Freq: Three times a day (TID) | ORAL | 0 refills | Status: DC
  Filled 2023-09-20 – 2023-09-22 (×2): qty 90, 30d supply, fill #0

## 2023-09-20 MED ORDER — HYDROMORPHONE HCL 2 MG PO TABS
2.0000 mg | ORAL_TABLET | Freq: Three times a day (TID) | ORAL | 0 refills | Status: AC
Start: 2023-09-19 — End: ?
  Filled 2023-09-20 – 2023-09-22 (×2): qty 90, 30d supply, fill #0

## 2023-09-20 MED ORDER — BACLOFEN 20 MG PO TABS
20.0000 mg | ORAL_TABLET | Freq: Three times a day (TID) | ORAL | 0 refills | Status: AC | PRN
Start: 2023-09-19 — End: ?
  Filled 2023-09-20: qty 90, 30d supply, fill #0

## 2023-09-22 ENCOUNTER — Other Ambulatory Visit: Payer: Self-pay

## 2023-09-22 ENCOUNTER — Other Ambulatory Visit (HOSPITAL_COMMUNITY): Payer: Self-pay

## 2023-10-20 ENCOUNTER — Other Ambulatory Visit (HOSPITAL_BASED_OUTPATIENT_CLINIC_OR_DEPARTMENT_OTHER): Payer: Self-pay

## 2023-10-20 ENCOUNTER — Other Ambulatory Visit: Payer: Self-pay

## 2023-10-20 ENCOUNTER — Other Ambulatory Visit (HOSPITAL_COMMUNITY): Payer: Self-pay

## 2023-10-20 ENCOUNTER — Encounter (HOSPITAL_COMMUNITY): Payer: Self-pay

## 2023-10-20 MED ORDER — TIZANIDINE HCL 2 MG PO CAPS
2.0000 mg | ORAL_CAPSULE | Freq: Three times a day (TID) | ORAL | 0 refills | Status: DC
  Filled 2023-10-20: qty 90, 30d supply, fill #0

## 2023-10-20 MED ORDER — NALOXONE HCL 4 MG/0.1ML NA LIQD
NASAL | 0 refills | Status: AC
  Filled 2023-10-20: qty 2, 30d supply, fill #0
  Filled 2023-10-20: qty 2, 1d supply, fill #0

## 2023-10-20 MED ORDER — PREGABALIN 75 MG PO CAPS
75.0000 mg | ORAL_CAPSULE | Freq: Three times a day (TID) | ORAL | 0 refills | Status: DC
  Filled 2023-10-20: qty 90, 30d supply, fill #0

## 2023-10-20 MED ORDER — HYDROXYZINE HCL 25 MG PO TABS
25.0000 mg | ORAL_TABLET | Freq: Three times a day (TID) | ORAL | 0 refills | Status: DC | PRN
  Filled 2023-10-20: qty 90, 30d supply, fill #0

## 2023-10-20 MED ORDER — HYDROMORPHONE HCL 2 MG PO TABS
2.0000 mg | ORAL_TABLET | Freq: Three times a day (TID) | ORAL | 0 refills | Status: DC
  Filled 2023-10-20: qty 90, 30d supply, fill #0

## 2023-10-21 ENCOUNTER — Other Ambulatory Visit (HOSPITAL_COMMUNITY): Payer: Self-pay

## 2023-10-23 ENCOUNTER — Other Ambulatory Visit (HOSPITAL_COMMUNITY): Payer: Self-pay

## 2023-10-24 ENCOUNTER — Other Ambulatory Visit (HOSPITAL_COMMUNITY): Payer: Self-pay

## 2023-11-20 ENCOUNTER — Other Ambulatory Visit (HOSPITAL_COMMUNITY): Payer: Self-pay

## 2023-11-20 MED ORDER — HYDROMORPHONE HCL 2 MG PO TABS
2.0000 mg | ORAL_TABLET | Freq: Three times a day (TID) | ORAL | 0 refills | Status: DC
  Filled 2023-11-20: qty 90, 30d supply, fill #0

## 2023-11-20 MED ORDER — TIZANIDINE HCL 2 MG PO CAPS
2.0000 mg | ORAL_CAPSULE | Freq: Three times a day (TID) | ORAL | 0 refills | Status: AC
  Filled 2023-11-20 – 2023-11-21 (×2): qty 90, 30d supply, fill #0

## 2023-11-20 MED ORDER — PREGABALIN 75 MG PO CAPS
75.0000 mg | ORAL_CAPSULE | Freq: Three times a day (TID) | ORAL | 0 refills | Status: DC
  Filled 2023-11-20: qty 90, 30d supply, fill #0

## 2023-11-21 ENCOUNTER — Other Ambulatory Visit (HOSPITAL_COMMUNITY): Payer: Self-pay

## 2023-11-21 ENCOUNTER — Other Ambulatory Visit: Payer: Self-pay

## 2023-11-21 MED ORDER — HYDROXYZINE HCL 25 MG PO TABS
25.0000 mg | ORAL_TABLET | Freq: Three times a day (TID) | ORAL | 0 refills | Status: AC | PRN
  Filled 2023-11-21: qty 90, 30d supply, fill #0

## 2023-12-22 ENCOUNTER — Other Ambulatory Visit: Payer: Self-pay

## 2023-12-22 ENCOUNTER — Other Ambulatory Visit (HOSPITAL_COMMUNITY): Payer: Self-pay

## 2023-12-22 MED ORDER — PREGABALIN 75 MG PO CAPS
75.0000 mg | ORAL_CAPSULE | Freq: Three times a day (TID) | ORAL | 0 refills | Status: DC
  Filled 2023-12-22: qty 90, 30d supply, fill #0

## 2023-12-22 MED ORDER — TIZANIDINE HCL 2 MG PO CAPS
2.0000 mg | ORAL_CAPSULE | Freq: Three times a day (TID) | ORAL | 0 refills | Status: DC | PRN
  Filled 2023-12-22: qty 90, 30d supply, fill #0

## 2023-12-22 MED ORDER — NALOXONE HCL 4 MG/0.1ML NA LIQD
NASAL | 0 refills | Status: AC
  Filled 2023-12-22: qty 2, 1d supply, fill #0

## 2023-12-22 MED ORDER — HYDROXYZINE HCL 50 MG PO TABS
50.0000 mg | ORAL_TABLET | Freq: Three times a day (TID) | ORAL | 0 refills | Status: DC | PRN
  Filled 2023-12-22: qty 90, 30d supply, fill #0

## 2023-12-22 MED ORDER — HYDROMORPHONE HCL 2 MG PO TABS
2.0000 mg | ORAL_TABLET | Freq: Three times a day (TID) | ORAL | 0 refills | Status: DC
Start: 2023-12-22 — End: 2024-01-22
  Filled 2023-12-22: qty 90, 30d supply, fill #0

## 2024-01-01 ENCOUNTER — Other Ambulatory Visit (HOSPITAL_COMMUNITY): Payer: Self-pay

## 2024-01-11 ENCOUNTER — Encounter: Payer: Self-pay | Admitting: Neurology

## 2024-01-11 ENCOUNTER — Ambulatory Visit (INDEPENDENT_AMBULATORY_CARE_PROVIDER_SITE_OTHER): Admitting: Neurology

## 2024-01-11 ENCOUNTER — Telehealth: Payer: Self-pay | Admitting: Neurology

## 2024-01-11 VITALS — BP 104/62 | HR 96 | Ht 69.0 in | Wt 158.0 lb

## 2024-01-11 DIAGNOSIS — R569 Unspecified convulsions: Secondary | ICD-10-CM | POA: Diagnosis not present

## 2024-01-11 NOTE — Telephone Encounter (Signed)
 no auth required sent to GI [phone]

## 2024-01-11 NOTE — Patient Instructions (Signed)
 Routine EEG MRI brain with and without contrast I will contact you to go over the result Keep follow-up with Dr. Return as needed
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
## 2024-01-18 ENCOUNTER — Ambulatory Visit (INDEPENDENT_AMBULATORY_CARE_PROVIDER_SITE_OTHER): Admitting: Neurology

## 2024-01-18 ENCOUNTER — Ambulatory Visit: Payer: Self-pay | Admitting: Neurology

## 2024-01-18 DIAGNOSIS — R569 Unspecified convulsions: Secondary | ICD-10-CM

## 2024-01-18 NOTE — Progress Notes (Signed)
 Please call and inform patient that her EEG (Brain wave test) was normal. In particular, there were no epileptiform discharges and no seizures. The event that you had during the photic stimulation (flashing lights) was not an epileptic seizure. No further action is required on this test at this time. Please keep any upcoming appointments or tests and  call us  with any interim questions, concerns, problems or updates. Thanks,   Pastor Falling, MD

## 2024-01-18 NOTE — Procedures (Signed)
    History:  63 year old woman with seizure like activity   EEG classification: Awake and drowsy  Duration: 29 minutes   Technical aspects: This EEG study was done with scalp electrodes positioned according to the 10-20 International system of electrode placement. Electrical activity was reviewed with band pass filter of 1-70Hz , sensitivity of 7 uV/mm, display speed of 35mm/sec with a 60Hz  notched filter applied as appropriate. EEG data were recorded continuously and digitally stored.   Description of the recording: The background rhythms of this recording consists of a fairly well modulated medium amplitude alpha rhythm of 9-10 Hz that is reactive to eye opening and closure. Present in the anterior head region is a 15-20 Hz beta activity. Photic stimulation was performed, did not show any abnormalities, but patient had an episode during photic, crying, upset but was able to follow tech's commands. Hyperventilation was also  performed, did not show any abnormalities. Drowsiness was manifested by background fragmentation. No abnormal epileptiform discharges seen during this recording. There was no focal slowing. There were no electrographic seizure identified.   Abnormality: None   Impression: This is a normal awake and drowsy EEG. No evidence of interictal epileptiform discharges. Normal EEGs, however, do not rule out epilepsy.    Lyndy Russman, MD [facility]

## 2024-01-19 NOTE — Telephone Encounter (Signed)
 Pt cld office for EEG results. Verified name & DOB. Read result notes from provider. Pt only questioned why it occurred. Explained there were no specifics given as to why that event occurred but no seizure activity showed on EEG. Pt voiced understanding and thanks for the info.

## 2024-01-22 ENCOUNTER — Other Ambulatory Visit (HOSPITAL_COMMUNITY): Payer: Self-pay

## 2024-01-22 ENCOUNTER — Other Ambulatory Visit: Payer: Self-pay

## 2024-01-22 MED ORDER — HYDROXYZINE HCL 50 MG PO TABS
50.0000 mg | ORAL_TABLET | Freq: Three times a day (TID) | ORAL | 0 refills | Status: DC | PRN
  Filled 2024-01-22: qty 90, 30d supply, fill #0

## 2024-01-22 MED ORDER — NALOXONE HCL 4 MG/0.1ML NA LIQD
1.0000 | NASAL | 0 refills | Status: AC
  Filled 2024-01-22: qty 2, 1d supply, fill #0

## 2024-01-22 MED ORDER — HYDROMORPHONE HCL 2 MG PO TABS
2.0000 mg | ORAL_TABLET | Freq: Four times a day (QID) | ORAL | 0 refills | Status: DC
  Filled 2024-01-22: qty 120, 30d supply, fill #0

## 2024-01-22 MED ORDER — ALENDRONATE SODIUM 70 MG PO TABS
70.0000 mg | ORAL_TABLET | ORAL | 4 refills | Status: AC
  Filled 2024-01-22: qty 12, 84d supply, fill #0

## 2024-01-22 MED ORDER — TIZANIDINE HCL 2 MG PO CAPS
2.0000 mg | ORAL_CAPSULE | Freq: Three times a day (TID) | ORAL | 0 refills | Status: AC | PRN
  Filled 2024-01-22: qty 90, 30d supply, fill #0

## 2024-01-22 MED ORDER — PREGABALIN 75 MG PO CAPS
75.0000 mg | ORAL_CAPSULE | Freq: Three times a day (TID) | ORAL | 0 refills | Status: DC
  Filled 2024-01-22: qty 90, 30d supply, fill #0

## 2024-02-01 ENCOUNTER — Other Ambulatory Visit (HOSPITAL_COMMUNITY): Payer: Self-pay

## 2024-02-04 ENCOUNTER — Ambulatory Visit
Admission: RE | Admit: 2024-02-04 | Discharge: 2024-02-04 | Disposition: A | Source: Ambulatory Visit | Attending: Neurology

## 2024-02-04 DIAGNOSIS — R569 Unspecified convulsions: Secondary | ICD-10-CM | POA: Diagnosis not present

## 2024-02-04 MED ORDER — GADOPICLENOL 0.5 MMOL/ML IV SOLN
7.0000 mL | Freq: Once | INTRAVENOUS | Status: AC | PRN
  Administered 2024-02-04: 7 mL via INTRAVENOUS

## 2024-02-06 ENCOUNTER — Other Ambulatory Visit (HOSPITAL_COMMUNITY): Payer: Self-pay

## 2024-02-06 ENCOUNTER — Other Ambulatory Visit: Payer: Self-pay

## 2024-02-06 MED ORDER — TIZANIDINE HCL 2 MG PO CAPS
2.0000 mg | ORAL_CAPSULE | Freq: Three times a day (TID) | ORAL | 0 refills | Status: AC | PRN
  Filled 2024-02-06: qty 90, 30d supply, fill #0

## 2024-02-06 NOTE — Progress Notes (Signed)
 Please call and advise the patient that the recent MRI Brain did not show any acute findings, such as a stroke, or mass or blood products. No further action is required on this test at this time. Please remind patient to keep any upcoming appointments or tests and to call us  with any interim questions, concerns, problems or updates. Thanks,   Pastor Falling, MD

## 2024-02-07 NOTE — Progress Notes (Signed)
 Please have patient call PCP for referral to an academic center like [place] or [place].

## 2024-02-21 ENCOUNTER — Other Ambulatory Visit (HOSPITAL_COMMUNITY): Payer: Self-pay

## 2024-02-21 ENCOUNTER — Other Ambulatory Visit: Payer: Self-pay

## 2024-02-21 MED ORDER — PREGABALIN 75 MG PO CAPS
75.0000 mg | ORAL_CAPSULE | Freq: Three times a day (TID) | ORAL | 0 refills | Status: AC
  Filled 2024-02-21 (×2): qty 90, 30d supply, fill #0

## 2024-02-21 MED ORDER — HYDROMORPHONE HCL 2 MG PO TABS
2.0000 mg | ORAL_TABLET | Freq: Four times a day (QID) | ORAL | 0 refills | Status: DC
  Filled 2024-02-21 (×2): qty 120, 30d supply, fill #0

## 2024-02-21 MED ORDER — TIZANIDINE HCL 2 MG PO CAPS
2.0000 mg | ORAL_CAPSULE | Freq: Three times a day (TID) | ORAL | 0 refills | Status: AC | PRN
  Filled 2024-02-21: qty 90, 30d supply, fill #0

## 2024-02-24 ENCOUNTER — Other Ambulatory Visit (HOSPITAL_COMMUNITY): Payer: Self-pay

## 2024-02-24 MED ORDER — HYDROXYZINE HCL 50 MG PO TABS
50.0000 mg | ORAL_TABLET | Freq: Three times a day (TID) | ORAL | 0 refills | Status: AC | PRN
  Filled 2024-02-24: qty 90, 30d supply, fill #0

## 2024-02-26 ENCOUNTER — Other Ambulatory Visit (HOSPITAL_COMMUNITY): Payer: Self-pay

## 2024-02-27 ENCOUNTER — Other Ambulatory Visit (HOSPITAL_COMMUNITY): Payer: Self-pay

## 2024-03-26 ENCOUNTER — Other Ambulatory Visit (HOSPITAL_COMMUNITY): Payer: Self-pay

## 2024-03-26 MED ORDER — HYDROXYZINE HCL 50 MG PO TABS
50.0000 mg | ORAL_TABLET | Freq: Three times a day (TID) | ORAL | 0 refills | Status: AC
  Filled 2024-03-26: qty 90, 30d supply, fill #0

## 2024-03-26 MED ORDER — ALENDRONATE SODIUM 70 MG PO TABS
70.0000 mg | ORAL_TABLET | ORAL | 4 refills | Status: AC
  Filled 2024-03-26: qty 12, 84d supply, fill #0

## 2024-03-26 MED ORDER — PREGABALIN 100 MG PO CAPS
100.0000 mg | ORAL_CAPSULE | Freq: Three times a day (TID) | ORAL | 0 refills | Status: DC
  Filled 2024-03-26: qty 90, 30d supply, fill #0

## 2024-03-26 MED ORDER — NALOXONE HCL 4 MG/0.1ML NA LIQD
NASAL | 0 refills | Status: AC
  Filled 2024-03-26: qty 2, 1d supply, fill #0

## 2024-03-26 MED ORDER — TIZANIDINE HCL 2 MG PO CAPS
2.0000 mg | ORAL_CAPSULE | Freq: Three times a day (TID) | ORAL | 0 refills | Status: AC | PRN
  Filled 2024-03-26: qty 90, 30d supply, fill #0

## 2024-03-26 MED ORDER — HYDROMORPHONE HCL 2 MG PO TABS
2.0000 mg | ORAL_TABLET | Freq: Four times a day (QID) | ORAL | 0 refills | Status: DC
  Filled 2024-03-26: qty 120, 30d supply, fill #0

## 2024-04-24 ENCOUNTER — Other Ambulatory Visit (HOSPITAL_COMMUNITY): Payer: Self-pay

## 2024-04-24 ENCOUNTER — Other Ambulatory Visit: Payer: Self-pay

## 2024-04-24 MED ORDER — PREGABALIN 100 MG PO CAPS
100.0000 mg | ORAL_CAPSULE | Freq: Three times a day (TID) | ORAL | 0 refills | Status: DC
  Filled 2024-04-24: qty 90, 30d supply, fill #0

## 2024-04-24 MED ORDER — NALOXONE HCL 4 MG/0.1ML NA LIQD
NASAL | 0 refills | Status: AC
  Filled 2024-04-24: qty 2, 1d supply, fill #0

## 2024-04-24 MED ORDER — ALENDRONATE SODIUM 70 MG PO TABS: 70.0000 mg | ORAL_TABLET | ORAL | 4 refills | Status: AC

## 2024-04-24 MED ORDER — HYDROXYZINE HCL 50 MG PO TABS
50.0000 mg | ORAL_TABLET | Freq: Three times a day (TID) | ORAL | 0 refills | Status: DC | PRN
  Filled 2024-04-24: qty 90, 30d supply, fill #0

## 2024-04-24 MED ORDER — TIZANIDINE HCL 2 MG PO CAPS
2.0000 mg | ORAL_CAPSULE | Freq: Three times a day (TID) | ORAL | 0 refills | Status: AC | PRN
  Filled 2024-04-24: qty 90, 30d supply, fill #0

## 2024-04-24 MED ORDER — HYDROMORPHONE HCL 2 MG PO TABS
2.0000 mg | ORAL_TABLET | Freq: Four times a day (QID) | ORAL | 0 refills | Status: DC
  Filled 2024-04-24: qty 120, 30d supply, fill #0

## 2024-05-27 ENCOUNTER — Other Ambulatory Visit (HOSPITAL_COMMUNITY): Payer: Self-pay

## 2024-05-27 ENCOUNTER — Other Ambulatory Visit: Payer: Self-pay

## 2024-05-27 MED ORDER — PREGABALIN 100 MG PO CAPS
100.0000 mg | ORAL_CAPSULE | Freq: Three times a day (TID) | ORAL | 0 refills | Status: AC
  Filled 2024-05-27: qty 90, 30d supply, fill #0

## 2024-05-27 MED ORDER — HYDROMORPHONE HCL 2 MG PO TABS
2.0000 mg | ORAL_TABLET | Freq: Four times a day (QID) | ORAL | 0 refills | Status: AC
  Filled 2024-05-27 (×2): qty 120, 30d supply, fill #0

## 2024-05-27 MED ORDER — NALOXONE HCL 4 MG/0.1ML NA LIQD
NASAL | 0 refills | Status: AC
  Filled 2024-05-27: qty 2, 1d supply, fill #0

## 2024-05-27 MED ORDER — HYDROXYZINE HCL 50 MG PO TABS
50.0000 mg | ORAL_TABLET | Freq: Three times a day (TID) | ORAL | 0 refills | Status: AC | PRN
  Filled 2024-05-27: qty 90, 30d supply, fill #0

## 2024-05-27 MED ORDER — MIRTAZAPINE 7.5 MG PO TABS
7.5000 mg | ORAL_TABLET | Freq: Every day | ORAL | 1 refills | Status: AC
  Filled 2024-05-27: qty 30, 30d supply, fill #0

## 2024-05-27 MED ORDER — TIZANIDINE HCL 2 MG PO CAPS
2.0000 mg | ORAL_CAPSULE | Freq: Three times a day (TID) | ORAL | 0 refills | Status: AC | PRN
  Filled 2024-05-27 (×2): qty 90, 30d supply, fill #0

## 2024-08-14 ENCOUNTER — Ambulatory Visit: Admitting: Internal Medicine
